# Patient Record
Sex: Male | Born: 1980 | Race: White | Hispanic: No | Marital: Married | Smoking: Current every day smoker
Health system: Southern US, Community
[De-identification: ages and names within clinical notes are randomized; demographics above are authoritative.]

## PROBLEM LIST (undated history)

## (undated) DIAGNOSIS — F1110 Opioid abuse, uncomplicated: Secondary | ICD-10-CM

## (undated) DIAGNOSIS — F1910 Other psychoactive substance abuse, uncomplicated: Secondary | ICD-10-CM

## (undated) DIAGNOSIS — F32A Depression, unspecified: Secondary | ICD-10-CM

## (undated) DIAGNOSIS — I10 Essential (primary) hypertension: Secondary | ICD-10-CM

## (undated) DIAGNOSIS — F419 Anxiety disorder, unspecified: Secondary | ICD-10-CM

## (undated) DIAGNOSIS — F1990 Other psychoactive substance use, unspecified, uncomplicated: Secondary | ICD-10-CM

## (undated) HISTORY — PX: SPINE SURGERY: SHX786

## (undated) HISTORY — DX: Depression, unspecified: F32.A

## (undated) HISTORY — DX: Anxiety disorder, unspecified: F41.9

## (undated) HISTORY — DX: Essential (primary) hypertension: I10

---

## 2021-02-05 ENCOUNTER — Inpatient Hospital Stay (HOSPITAL_COMMUNITY)
Admission: EM | Admit: 2021-02-05 | Discharge: 2021-02-07 | DRG: 552 | Payer: No Typology Code available for payment source | Attending: General Surgery | Admitting: General Surgery

## 2021-02-05 ENCOUNTER — Other Ambulatory Visit: Payer: Self-pay

## 2021-02-05 ENCOUNTER — Emergency Department (HOSPITAL_COMMUNITY): Payer: No Typology Code available for payment source

## 2021-02-05 DIAGNOSIS — Z20822 Contact with and (suspected) exposure to covid-19: Secondary | ICD-10-CM | POA: Diagnosis present

## 2021-02-05 DIAGNOSIS — S12110A Anterior displaced Type II dens fracture, initial encounter for closed fracture: Principal | ICD-10-CM | POA: Diagnosis present

## 2021-02-05 DIAGNOSIS — T1490XA Injury, unspecified, initial encounter: Secondary | ICD-10-CM | POA: Diagnosis present

## 2021-02-05 DIAGNOSIS — E876 Hypokalemia: Secondary | ICD-10-CM | POA: Diagnosis present

## 2021-02-05 DIAGNOSIS — Z23 Encounter for immunization: Secondary | ICD-10-CM

## 2021-02-05 DIAGNOSIS — S12100A Unspecified displaced fracture of second cervical vertebra, initial encounter for closed fracture: Secondary | ICD-10-CM

## 2021-02-05 DIAGNOSIS — S01511A Laceration without foreign body of lip, initial encounter: Secondary | ICD-10-CM | POA: Diagnosis present

## 2021-02-05 DIAGNOSIS — S022XXA Fracture of nasal bones, initial encounter for closed fracture: Secondary | ICD-10-CM | POA: Diagnosis present

## 2021-02-05 DIAGNOSIS — S12090A Other displaced fracture of first cervical vertebra, initial encounter for closed fracture: Secondary | ICD-10-CM | POA: Diagnosis present

## 2021-02-05 DIAGNOSIS — S0181XA Laceration without foreign body of other part of head, initial encounter: Secondary | ICD-10-CM | POA: Diagnosis present

## 2021-02-05 DIAGNOSIS — F17210 Nicotine dependence, cigarettes, uncomplicated: Secondary | ICD-10-CM | POA: Diagnosis present

## 2021-02-05 LAB — CBC
HCT: 44.2 % (ref 39.0–52.0)
Hemoglobin: 15.1 g/dL (ref 13.0–17.0)
MCH: 31.3 pg (ref 26.0–34.0)
MCHC: 34.2 g/dL (ref 30.0–36.0)
MCV: 91.7 fL (ref 80.0–100.0)
Platelets: 257 10*3/uL (ref 150–400)
RBC: 4.82 MIL/uL (ref 4.22–5.81)
RDW: 12.9 % (ref 11.5–15.5)
WBC: 11.3 10*3/uL — ABNORMAL HIGH (ref 4.0–10.5)
nRBC: 0 % (ref 0.0–0.2)

## 2021-02-05 LAB — URINALYSIS, ROUTINE W REFLEX MICROSCOPIC
Bacteria, UA: NONE SEEN
Bilirubin Urine: NEGATIVE
Glucose, UA: NEGATIVE mg/dL
Ketones, ur: NEGATIVE mg/dL
Leukocytes,Ua: NEGATIVE
Nitrite: NEGATIVE
Protein, ur: NEGATIVE mg/dL
Specific Gravity, Urine: 1.016 (ref 1.005–1.030)
pH: 6 (ref 5.0–8.0)

## 2021-02-05 LAB — I-STAT CHEM 8, ED
BUN: 15 mg/dL (ref 6–20)
Calcium, Ion: 1.1 mmol/L — ABNORMAL LOW (ref 1.15–1.40)
Chloride: 106 mmol/L (ref 98–111)
Creatinine, Ser: 1 mg/dL (ref 0.61–1.24)
Glucose, Bld: 138 mg/dL — ABNORMAL HIGH (ref 70–99)
HCT: 46 % (ref 39.0–52.0)
Hemoglobin: 15.6 g/dL (ref 13.0–17.0)
Potassium: 3.1 mmol/L — ABNORMAL LOW (ref 3.5–5.1)
Sodium: 142 mmol/L (ref 135–145)
TCO2: 25 mmol/L (ref 22–32)

## 2021-02-05 LAB — COMPREHENSIVE METABOLIC PANEL
ALT: 16 U/L (ref 0–44)
AST: 38 U/L (ref 15–41)
Albumin: 3.9 g/dL (ref 3.5–5.0)
Alkaline Phosphatase: 85 U/L (ref 38–126)
Anion gap: 11 (ref 5–15)
BUN: 14 mg/dL (ref 6–20)
CO2: 24 mmol/L (ref 22–32)
Calcium: 9.1 mg/dL (ref 8.9–10.3)
Chloride: 105 mmol/L (ref 98–111)
Creatinine, Ser: 1.15 mg/dL (ref 0.61–1.24)
GFR, Estimated: >60 mL/min (ref >60)
Glucose, Bld: 137 mg/dL — ABNORMAL HIGH (ref 70–99)
Potassium: 3.5 mmol/L (ref 3.5–5.1)
Sodium: 140 mmol/L (ref 135–145)
Total Bilirubin: 0.9 mg/dL (ref 0.3–1.2)
Total Protein: 7.1 g/dL (ref 6.5–8.1)

## 2021-02-05 LAB — SAMPLE TO BLOOD BANK

## 2021-02-05 LAB — PROTIME-INR
INR: 1 (ref 0.8–1.2)
Prothrombin Time: 13.1 seconds (ref 11.4–15.2)

## 2021-02-05 LAB — CREATININE, SERUM
Creatinine, Ser: 0.92 mg/dL (ref 0.61–1.24)
GFR, Estimated: >60 mL/min (ref >60)

## 2021-02-05 LAB — RAPID URINE DRUG SCREEN, HOSP PERFORMED
Amphetamines: POSITIVE — AB
Barbiturates: NOT DETECTED
Benzodiazepines: POSITIVE — AB
Cocaine: POSITIVE — AB
Opiates: POSITIVE — AB
Tetrahydrocannabinol: NOT DETECTED

## 2021-02-05 LAB — RESP PANEL BY RT-PCR (FLU A&B, COVID) ARPGX2
Influenza A by PCR: NEGATIVE
Influenza B by PCR: NEGATIVE
SARS Coronavirus 2 by RT PCR: NEGATIVE

## 2021-02-05 LAB — MAGNESIUM: Magnesium: 2 mg/dL (ref 1.7–2.4)

## 2021-02-05 LAB — CK: Total CK: 153 U/L (ref 49–397)

## 2021-02-05 LAB — LACTIC ACID, PLASMA: Lactic Acid, Venous: 2.7 mmol/L (ref 0.5–1.9)

## 2021-02-05 LAB — HIV ANTIBODY (ROUTINE TESTING W REFLEX): HIV Screen 4th Generation wRfx: NONREACTIVE

## 2021-02-05 LAB — ETHANOL: Alcohol, Ethyl (B): <10 mg/dL (ref <10)

## 2021-02-05 MED ORDER — LIDOCAINE-EPINEPHRINE 1 %-1:100000 IJ SOLN
20.0000 mL | Freq: Once | INTRAMUSCULAR | Status: AC
  Administered 2021-02-05: 20 mL
  Filled 2021-02-05: qty 1

## 2021-02-05 MED ORDER — METHOCARBAMOL 1000 MG/10ML IJ SOLN
500.0000 mg | Freq: Four times a day (QID) | INTRAVENOUS | Status: DC | PRN
  Administered 2021-02-05: 500 mg via INTRAVENOUS
  Filled 2021-02-05 (×2): qty 5

## 2021-02-05 MED ORDER — METOPROLOL TARTRATE 5 MG/5ML IV SOLN: 5.0000 mg | Freq: Four times a day (QID) | INTRAVENOUS | Status: DC | PRN

## 2021-02-05 MED ORDER — SODIUM CHLORIDE 0.9 % IV BOLUS
1000.0000 mL | Freq: Once | INTRAVENOUS | Status: AC
  Administered 2021-02-05: 1000 mL via INTRAVENOUS

## 2021-02-05 MED ORDER — PANTOPRAZOLE SODIUM 40 MG PO TBEC
40.0000 mg | DELAYED_RELEASE_TABLET | Freq: Every day | ORAL | Status: DC
  Administered 2021-02-07: 10:00:00 40 mg via ORAL
  Filled 2021-02-05: qty 1

## 2021-02-05 MED ORDER — POTASSIUM CHLORIDE 10 MEQ/100ML IV SOLN
10.0000 meq | INTRAVENOUS | Status: AC
  Administered 2021-02-05: 10 meq via INTRAVENOUS
  Filled 2021-02-05 (×2): qty 100

## 2021-02-05 MED ORDER — ONDANSETRON HCL 4 MG/2ML IJ SOLN: 4.0000 mg | Freq: Four times a day (QID) | INTRAMUSCULAR | Status: DC | PRN

## 2021-02-05 MED ORDER — PANTOPRAZOLE SODIUM 40 MG IV SOLR
40.0000 mg | Freq: Every day | INTRAVENOUS | Status: DC
  Administered 2021-02-05 – 2021-02-06 (×2): 40 mg via INTRAVENOUS
  Filled 2021-02-05 (×2): qty 40

## 2021-02-05 MED ORDER — HYDROMORPHONE HCL 1 MG/ML IJ SOLN
1.0000 mg | Freq: Once | INTRAMUSCULAR | Status: AC
  Administered 2021-02-05: 1 mg via INTRAVENOUS
  Filled 2021-02-05 (×2): qty 1

## 2021-02-05 MED ORDER — POTASSIUM CHLORIDE 10 MEQ/100ML IV SOLN
10.0000 meq | INTRAVENOUS | Status: AC
  Administered 2021-02-05 (×3): 10 meq via INTRAVENOUS
  Filled 2021-02-05 (×2): qty 100

## 2021-02-05 MED ORDER — ACETAMINOPHEN 500 MG PO TABS
1000.0000 mg | ORAL_TABLET | Freq: Three times a day (TID) | ORAL | Status: DC
  Administered 2021-02-05 – 2021-02-07 (×5): 1000 mg via ORAL
  Filled 2021-02-05 (×5): qty 2

## 2021-02-05 MED ORDER — SODIUM CHLORIDE 0.9 % IV SOLN: INTRAVENOUS | Status: DC

## 2021-02-05 MED ORDER — CEFAZOLIN SODIUM-DEXTROSE 2-4 GM/100ML-% IV SOLN
2.0000 g | Freq: Once | INTRAVENOUS | Status: AC
  Administered 2021-02-05: 2 g via INTRAVENOUS
  Filled 2021-02-05: qty 100

## 2021-02-05 MED ORDER — ENOXAPARIN SODIUM 30 MG/0.3ML IJ SOSY
30.0000 mg | PREFILLED_SYRINGE | Freq: Two times a day (BID) | INTRAMUSCULAR | Status: DC
  Administered 2021-02-06 – 2021-02-07 (×3): 30 mg via SUBCUTANEOUS
  Filled 2021-02-05 (×2): qty 0.3

## 2021-02-05 MED ORDER — POLYETHYLENE GLYCOL 3350 17 G PO PACK: 17.0000 g | PACK | Freq: Every day | ORAL | Status: DC | PRN

## 2021-02-05 MED ORDER — ACETAMINOPHEN 325 MG PO TABS: 650.0000 mg | ORAL_TABLET | ORAL | Status: DC | PRN

## 2021-02-05 MED ORDER — METHOCARBAMOL 500 MG PO TABS
500.0000 mg | ORAL_TABLET | Freq: Four times a day (QID) | ORAL | Status: DC | PRN
  Administered 2021-02-06 (×2): 500 mg via ORAL
  Filled 2021-02-05 (×2): qty 1

## 2021-02-05 MED ORDER — OXYCODONE HCL 5 MG PO TABS
5.0000 mg | ORAL_TABLET | ORAL | Status: DC | PRN
  Administered 2021-02-05 – 2021-02-07 (×9): 10 mg via ORAL
  Filled 2021-02-05 (×9): qty 2

## 2021-02-05 MED ORDER — DOCUSATE SODIUM 100 MG PO CAPS
100.0000 mg | ORAL_CAPSULE | Freq: Two times a day (BID) | ORAL | Status: DC
  Administered 2021-02-06: 100 mg via ORAL
  Filled 2021-02-05 (×2): qty 1

## 2021-02-05 MED ORDER — SALINE SPRAY 0.65 % NA SOLN
1.0000 | NASAL | Status: DC | PRN
  Filled 2021-02-05: qty 44

## 2021-02-05 MED ORDER — ONDANSETRON 4 MG PO TBDP: 4.0000 mg | ORAL_TABLET | Freq: Four times a day (QID) | ORAL | Status: DC | PRN

## 2021-02-05 MED ORDER — HYDROMORPHONE HCL 1 MG/ML IJ SOLN
1.0000 mg | INTRAMUSCULAR | Status: DC | PRN
  Administered 2021-02-05 – 2021-02-07 (×7): 1 mg via INTRAVENOUS
  Filled 2021-02-05 (×7): qty 1

## 2021-02-05 MED ORDER — TETANUS-DIPHTH-ACELL PERTUSSIS 5-2.5-18.5 LF-MCG/0.5 IM SUSY
0.5000 mL | PREFILLED_SYRINGE | Freq: Once | INTRAMUSCULAR | Status: AC
  Administered 2021-02-05: 0.5 mL via INTRAMUSCULAR
  Filled 2021-02-05: qty 0.5

## 2021-02-05 MED ORDER — SODIUM CHLORIDE 0.45 % IV SOLN: INTRAVENOUS | Status: DC
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
## 2021-02-05 NOTE — ED Notes (Signed)
EDP at bedside suturing pt's lacerations.
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
## 2021-02-05 NOTE — ED Notes (Signed)
Delay in getting CT due to difficulty getting IV access

## 2021-02-05 NOTE — ED Notes (Signed)
MD at bedside to attempt US IV.  
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
## 2021-02-05 NOTE — ED Notes (Signed)
Pt transported to CT with RN

## 2021-02-05 NOTE — TOC CAGE-AID Note (Signed)
Transition of Care [facility]) - CAGE-AID Screening   Patient Details  Name: Roberto Duran MRN: [id number] Date of Birth: Dec 05, 1980  [facility]) CM/SW Contact:    Erin Sons, LCSW Phone Number: 02/05/2021, 3:49 PM   Clinical Narrative:  CAGE-AID completed; score of 0. Pt was difficult to understand at times due to mumbling and CSW had to request pt repeat himself at times. Pt explains he has been in [place] only a few weeks. CSW is unable to get a full understanding of pt's living situation and where he is from. He does mention he has a wife but states she does not have her phone. Pt states he does not drink alcohol or use any other substances. CSW explained that his UDS was positive for benzos, opiates, cocaine, and amphetamines. Pt did not respond when CSW explained this. CSW explained his purpose was to offer support and that purpose is not to condemn or get pt into trouble. CSW offered to discuss substance use. Pt did not have a response regarding his positive UDS. CSW explained he would leave resource list and phone number bedside. CSW explained he could call or request social worker if he wanted to discuss substance use further.   CAGE-AID Screening:    Have You Ever Felt You Ought to Cut Down on Your Drinking or Drug Use?: No Have People Annoyed You By Critizing Your Drinking Or Drug Use?: No Have You Felt Bad Or Guilty About Your Drinking Or Drug Use?: No Have You Ever Had a Drink or Used Drugs First Thing In The Morning to Steady Your Nerves or to Get Rid of a Hangover?: No CAGE-AID Score: 0  Substance Abuse Education Offered: Yes
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
## 2021-02-05 NOTE — ED Notes (Signed)
Unable to obtain BP at this time. Attempting IV access on both arms.

## 2021-02-05 NOTE — ED Notes (Signed)
Lac to center forehead, L eyebrow, R upper lip.

## 2021-02-05 NOTE — ED Notes (Addendum)
Pt responding when asking orientation questions, but not speaking clear enough to determine orientation.

## 2021-02-05 NOTE — ED Notes (Signed)
Pt transferred to MRI
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
## 2021-02-05 NOTE — ED Notes (Signed)
Unable to get IV access with [place], by multiple RN and EDP. Per EDP, will get IV access later and get non contrast CT scans at this time.

## 2021-02-05 NOTE — ED Notes (Signed)
IV team at bedside 

## 2021-02-05 NOTE — ED Notes (Signed)
Pt did receive one bag of IV K+. Pharmacy was approached by this RN & will retime the remaining bags to infuse upon his arrival.

## 2021-02-05 NOTE — ED Notes (Signed)
Speech at bedside

## 2021-02-05 NOTE — ED Notes (Signed)
[place] collar placed on pt 

## 2021-02-06 ENCOUNTER — Other Ambulatory Visit (HOSPITAL_COMMUNITY): Payer: Self-pay

## 2021-02-06 ENCOUNTER — Other Ambulatory Visit: Payer: Self-pay

## 2021-02-06 DIAGNOSIS — S0181XA Laceration without foreign body of other part of head, initial encounter: Secondary | ICD-10-CM | POA: Diagnosis present

## 2021-02-06 DIAGNOSIS — F17210 Nicotine dependence, cigarettes, uncomplicated: Secondary | ICD-10-CM | POA: Diagnosis present

## 2021-02-06 DIAGNOSIS — Z23 Encounter for immunization: Secondary | ICD-10-CM | POA: Diagnosis not present

## 2021-02-06 DIAGNOSIS — S12110A Anterior displaced Type II dens fracture, initial encounter for closed fracture: Secondary | ICD-10-CM | POA: Diagnosis present

## 2021-02-06 DIAGNOSIS — Z20822 Contact with and (suspected) exposure to covid-19: Secondary | ICD-10-CM | POA: Diagnosis present

## 2021-02-06 DIAGNOSIS — S01511A Laceration without foreign body of lip, initial encounter: Secondary | ICD-10-CM | POA: Diagnosis present

## 2021-02-06 DIAGNOSIS — T1490XA Injury, unspecified, initial encounter: Secondary | ICD-10-CM | POA: Diagnosis present

## 2021-02-06 DIAGNOSIS — E876 Hypokalemia: Secondary | ICD-10-CM | POA: Diagnosis present

## 2021-02-06 DIAGNOSIS — S022XXA Fracture of nasal bones, initial encounter for closed fracture: Secondary | ICD-10-CM | POA: Diagnosis present

## 2021-02-06 DIAGNOSIS — S12090A Other displaced fracture of first cervical vertebra, initial encounter for closed fracture: Secondary | ICD-10-CM | POA: Diagnosis present

## 2021-02-06 LAB — BASIC METABOLIC PANEL
Anion gap: 8 (ref 5–15)
BUN: 6 mg/dL (ref 6–20)
CO2: 22 mmol/L (ref 22–32)
Calcium: 8.9 mg/dL (ref 8.9–10.3)
Chloride: 107 mmol/L (ref 98–111)
Creatinine, Ser: 0.8 mg/dL (ref 0.61–1.24)
GFR, Estimated: >60 mL/min (ref >60)
Glucose, Bld: 103 mg/dL — ABNORMAL HIGH (ref 70–99)
Potassium: 3.3 mmol/L — ABNORMAL LOW (ref 3.5–5.1)
Sodium: 137 mmol/L (ref 135–145)

## 2021-02-06 LAB — CBC
HCT: 42.4 % (ref 39.0–52.0)
Hemoglobin: 13.9 g/dL (ref 13.0–17.0)
MCH: 30.2 pg (ref 26.0–34.0)
MCHC: 32.8 g/dL (ref 30.0–36.0)
MCV: 92.2 fL (ref 80.0–100.0)
Platelets: 227 10*3/uL (ref 150–400)
RBC: 4.6 MIL/uL (ref 4.22–5.81)
RDW: 13.1 % (ref 11.5–15.5)
WBC: 8.7 10*3/uL (ref 4.0–10.5)
nRBC: 0 % (ref 0.0–0.2)

## 2021-02-06 MED ORDER — IBUPROFEN 800 MG PO TABS: 800.0000 mg | ORAL_TABLET | Freq: Three times a day (TID) | ORAL | 0 refills | Status: DC | PRN

## 2021-02-06 MED ORDER — POTASSIUM CHLORIDE 10 MEQ/100ML IV SOLN
10.0000 meq | INTRAVENOUS | Status: AC
  Administered 2021-02-06 (×4): 10 meq via INTRAVENOUS
  Filled 2021-02-06 (×4): qty 100

## 2021-02-06 MED ORDER — ACETAMINOPHEN 500 MG PO TABS
1000.0000 mg | ORAL_TABLET | Freq: Three times a day (TID) | ORAL | 0 refills | Status: AC
  Filled 2021-02-06: qty 42, 7d supply, fill #0

## 2021-02-06 MED ORDER — DOCUSATE SODIUM 100 MG PO CAPS
100.0000 mg | ORAL_CAPSULE | Freq: Every day | ORAL | 0 refills | Status: DC | PRN
Start: 2021-02-06 — End: 2021-05-27
  Filled 2021-02-06: qty 10, 10d supply, fill #0

## 2021-02-06 MED ORDER — METHOCARBAMOL 500 MG PO TABS
500.0000 mg | ORAL_TABLET | Freq: Four times a day (QID) | ORAL | 0 refills | Status: AC | PRN
  Filled 2021-02-06: qty 28, 7d supply, fill #0

## 2021-02-06 MED ORDER — OXYCODONE HCL 5 MG PO TABS
5.0000 mg | ORAL_TABLET | Freq: Four times a day (QID) | ORAL | 0 refills | Status: AC | PRN
  Filled 2021-02-06: qty 15, 4d supply, fill #0

## 2021-02-06 NOTE — Progress Notes (Signed)
Mobility Specialist Progress Note:   02/06/21 1500  Mobility  Activity Sat and stood x 3  Level of Assistance Standby assist, set-up cues, supervision of patient - no hands on  Mobility Response Tolerated poorly  Mobility performed by Mobility specialist  $Mobility charge 1 Mobility   Pt tearful about wife and children this afternoon. Pt stood independently, hoping to ambulate in hallway.  Upon standing, pt felt nauseous and light-headed d/t significant pain. Further mobility deferred, pt back in bed.  Addison Lank Mobility Specialist  Phone [phone]
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
## 2021-02-06 NOTE — Progress Notes (Signed)
Patients wife did not show up for collar teaching. Wife will receive teaching tomorrow morning and then patient will be discharged after.
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
## 2021-02-07 NOTE — Progress Notes (Signed)
Cab transportation was being set up for patient @2 :30p However, [facility] police department came with an arrest warrant for patient . Patient discharged with police with all belongings, discharge paperwork, and discharge medications, and cellphone. IV removed before discharge.

## 2021-02-07 NOTE — Progress Notes (Signed)
Discharge instructions given to pt. Patient verbalized understanding of all teaching.
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
## 2021-02-07 NOTE — TOC Transition Note (Signed)
Transition of Care [facility]) - CM/SW Discharge Note   Patient Details  Name: MERVIN RAMIRES MRN: [id number] Date of Birth: 1981-02-26  [facility]) CM/SW Contact:  Glennon Mac, RN Phone Number: 02/07/2021, 1:41 PM   Clinical Narrative:    Pt medically stable for discharge home today with spouse to assist.  PT/OT recommending OP therapy, and pt agreeable to follow up; referral has been made to [facility] OP Rehab for continued therapies.  Discharge medications have been filled by [facility] pharmacy using MATCH letter, as patient is uninsured.  Originally scheduled transport cancelled, as [employer] officer on unit to arrest patient.  She states he has multiple warrants, and wife has been arrested as well.    Final next level of care: OP Rehab Barriers to Discharge: Barriers Resolved   Patient Goals and CMS Choice Patient states their goals for this hospitalization and ongoing recovery are:: to go home                            Discharge Plan and Services   Discharge Planning Services: CM Consult, Medication Assistance, MATCH Program, [facility], Follow-up appt scheduled            DME Arranged: Walker rolling DME Agency: [employer] Date DME Agency Contacted: 02/06/21 Time DME Agency Contacted: 1300 Representative spoke with at DME Agency: Velna Hatchet                Readmission Risk Interventions No flowsheet data found.  Quintella Baton, RN, BSN  Trauma/Neuro ICU Case Manager [phone]

## 2021-02-22 ENCOUNTER — Ambulatory Visit: Payer: Self-pay | Admitting: *Deleted
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
## 2021-02-22 NOTE — Telephone Encounter (Signed)
Informed of appt availability for 02/23/2021 at [facility] and he could also establish care at that site.   Patient states he his out of town in [place]. He will be back on Monday. Informed that we could reach back out to him with appt available with the [facility] operations.  Patient appreciative and verbalized understanding.

## 2021-02-23 ENCOUNTER — Ambulatory Visit: Payer: Self-pay | Attending: General Surgery

## 2021-02-28 ENCOUNTER — Ambulatory Visit: Payer: Self-pay

## 2021-02-28 ENCOUNTER — Ambulatory Visit: Payer: Self-pay | Admitting: *Deleted
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
## 2021-02-28 NOTE — Telephone Encounter (Signed)
Unable to reach patient after 3 attempts by PEC NT, routing to the provider for resolution per protocol. ° °

## 2021-02-28 NOTE — Telephone Encounter (Signed)
2nd attempt. Pt called, unable to leave VM d/t mailbox full

## 2021-03-20 ENCOUNTER — Other Ambulatory Visit: Payer: Self-pay

## 2021-03-20 ENCOUNTER — Ambulatory Visit: Payer: 59 | Attending: Internal Medicine | Admitting: Internal Medicine

## 2021-03-20 ENCOUNTER — Encounter: Payer: Self-pay | Admitting: Internal Medicine

## 2021-03-20 DIAGNOSIS — F1990 Other psychoactive substance use, unspecified, uncomplicated: Secondary | ICD-10-CM | POA: Diagnosis not present

## 2021-03-20 DIAGNOSIS — S022XXA Fracture of nasal bones, initial encounter for closed fracture: Secondary | ICD-10-CM | POA: Diagnosis not present

## 2021-03-20 DIAGNOSIS — S12100A Unspecified displaced fracture of second cervical vertebra, initial encounter for closed fracture: Secondary | ICD-10-CM

## 2021-03-20 DIAGNOSIS — Z09 Encounter for follow-up examination after completed treatment for conditions other than malignant neoplasm: Secondary | ICD-10-CM | POA: Diagnosis not present
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
## 2021-03-21 ENCOUNTER — Inpatient Hospital Stay: Payer: Self-pay | Admitting: Family Medicine

## 2021-05-08 ENCOUNTER — Other Ambulatory Visit: Payer: Self-pay | Admitting: Neurological Surgery

## 2021-05-10 ENCOUNTER — Inpatient Hospital Stay (HOSPITAL_COMMUNITY)
Admission: RE | Admit: 2021-05-10 | Discharge: 2021-05-10 | Disposition: A | Discharge status: Home / Self Care | Payer: Self-pay | Source: Ambulatory Visit

## 2021-05-10 NOTE — Pre-Procedure Instructions (Signed)
Surgical Instructions ? ? ? Your procedure is scheduled on Thursday, March 23. ? Report to [facility] Main Entrance "A" at 8:00 A.M., then check in with the Admitting office. ? Call this number if you have problems the morning of surgery: ? [phone] ? ? If you have any questions prior to your surgery date call [phone]: Open Monday-Friday 8am-4pm ? ? ? Remember: ? Do not eat after midnight the night before your surgery ? ?You may drink clear liquids until 7:00AM the morning of your surgery.   ?Clear liquids allowed are: Water, Non-Citrus Juices (without pulp), Carbonated Beverages, Clear Tea, Black Coffee ONLY (NO MILK, CREAM OR POWDERED CREAMER of any kind), and Gatorade ?  ? Take these medicines the morning of surgery with A SIP OF WATER: NONE ? ? ?As of today, STOP taking any Aspirin (unless otherwise instructed by your surgeon) Aleve, Naproxen, Ibuprofen, Motrin, Advil, Goody's, BC's, all herbal medications, fish oil, and all vitamins. ? ?         ?Do not wear jewelry or makeup ?Do not wear lotions, powders, perfumes/colognes, or deodorant. ?Do not shave 48 hours prior to surgery.  Men may shave face and neck. ?Do not bring valuables to the hospital. ?Do not wear nail polish, gel polish, artificial nails, or any other type of covering on natural nails (fingers and toes) ?If you have artificial nails or gel coating that need to be removed by a nail salon, please have this removed prior to surgery. Artificial nails or gel coating may interfere with anesthesia's ability to adequately monitor your vital signs. ? ?[facility] is not responsible for any belongings or valuables. .  ? ?Do NOT Smoke (Tobacco/Vaping)  24 hours prior to your procedure ? ?If you use a CPAP at night, you may bring your mask for your overnight stay. ?  ?Contacts, glasses, hearing aids, dentures or partials may not be worn into surgery, please bring cases for these belongings ?  ?For patients admitted to the hospital, discharge time  will be determined by your treatment team. ?  ?Patients discharged the day of surgery will not be allowed to drive home, and someone needs to stay with them for 24 hours. ? ?NO VISITORS WILL BE ALLOWED IN PRE-OP WHERE PATIENTS ARE PREPPED FOR SURGERY.  ONLY 1 SUPPORT PERSON MAY BE PRESENT IN THE WAITING ROOM WHILE YOU ARE IN SURGERY.  IF YOU ARE TO BE ADMITTED, ONCE YOU ARE IN YOUR ROOM YOU WILL BE ALLOWED TWO (2) VISITORS. 1 (ONE) VISITOR MAY STAY OVERNIGHT BUT MUST ARRIVE TO THE ROOM BY 8pm.  Minor children may have two parents present. Special consideration for safety and communication needs will be reviewed on a case by case basis. ? ?Special instructions:   ? ?Oral Hygiene is also important to reduce your risk of infection.  Remember - BRUSH YOUR TEETH THE MORNING OF SURGERY WITH YOUR REGULAR TOOTHPASTE ? ? ?[facility]- Preparing For Surgery ? ?Before surgery, you can play an important role. Because skin is not sterile, your skin needs to be as free of germs as possible. You can reduce the number of germs on your skin by washing with CHG (chlorahexidine gluconate) Soap before surgery.  CHG is an antiseptic cleaner which kills germs and bonds with the skin to continue killing germs even after washing.   ? ? ?Please do not use if you have an allergy to CHG or antibacterial soaps. If your skin becomes reddened/irritated stop using the CHG.  ?Do not  shave (including legs and underarms) for at least 48 hours prior to first CHG shower. It is OK to shave your face. ? ?Please follow these instructions carefully. ?  ? ? Shower the NIGHT BEFORE SURGERY and the MORNING OF SURGERY with CHG Soap.  ? If you chose to wash your hair, wash your hair first as usual with your normal shampoo. After you shampoo, rinse your hair and body thoroughly to remove the shampoo.  Then [employer] and genitals (private parts) with your normal soap and rinse thoroughly to remove soap. ? ?After that Use CHG Soap as you would any other liquid  soap. You can apply CHG directly to the skin and wash gently with a scrungie or a clean washcloth.  ? ?Apply the CHG Soap to your body ONLY FROM THE NECK DOWN.  Do not use on open wounds or open sores. Avoid contact with your eyes, ears, mouth and genitals (private parts). Wash Face and genitals (private parts)  with your normal soap.  ? ?Wash thoroughly, paying special attention to the area where your surgery will be performed. ? ?Thoroughly rinse your body with warm water from the neck down. ? ?DO NOT shower/wash with your normal soap after using and rinsing off the CHG Soap. ? ?Pat yourself dry with a CLEAN TOWEL. ? ?Wear CLEAN PAJAMAS to bed the night before surgery ? ?Place CLEAN SHEETS on your bed the night before your surgery ? ?DO NOT SLEEP WITH PETS. ? ? ?Day of Surgery: ? ?Take a shower with CHG soap. ?Wear Clean/Comfortable clothing the morning of surgery ?Do not apply any deodorants/lotions.   ?Remember to brush your teeth WITH YOUR REGULAR TOOTHPASTE. ? ? ? ?COVID testing ? ?If you are going to stay overnight or be admitted after your procedure/surgery and require a pre-op COVID test, please follow these instructions after your COVID test  ? ?You are not required to quarantine however you are required to wear a well-fitting mask when you are out and around people not in your household.  If your mask becomes wet or soiled, replace with a new one. ? ?Wash your hands often with soap and water for 20 seconds or clean your hands with an alcohol-based hand sanitizer that contains at least 60% alcohol. ? ?Do not share personal items. ? ?Notify your provider: ?if you are in close contact with someone who has COVID  ?or if you develop a fever of 100.4 or greater, sneezing, cough, sore throat, shortness of breath or body aches. ? ?  ?Please read over the following fact sheets that you were given.  ? ?

## 2021-05-12 ENCOUNTER — Emergency Department (HOSPITAL_COMMUNITY): Payer: 59

## 2021-05-12 ENCOUNTER — Inpatient Hospital Stay (HOSPITAL_COMMUNITY)
Admission: EM | Admit: 2021-05-12 | Discharge: 2021-05-27 | DRG: 853 | Disposition: A | Discharge status: Home / Self Care | Payer: 59 | Attending: Family Medicine | Admitting: Family Medicine

## 2021-05-12 DIAGNOSIS — A4101 Sepsis due to Methicillin susceptible Staphylococcus aureus: Secondary | ICD-10-CM | POA: Diagnosis not present

## 2021-05-12 DIAGNOSIS — F17210 Nicotine dependence, cigarettes, uncomplicated: Secondary | ICD-10-CM | POA: Diagnosis present

## 2021-05-12 DIAGNOSIS — J189 Pneumonia, unspecified organism: Secondary | ICD-10-CM | POA: Diagnosis present

## 2021-05-12 DIAGNOSIS — I330 Acute and subacute infective endocarditis: Secondary | ICD-10-CM | POA: Diagnosis present

## 2021-05-12 DIAGNOSIS — F1990 Other psychoactive substance use, unspecified, uncomplicated: Secondary | ICD-10-CM | POA: Diagnosis present

## 2021-05-12 DIAGNOSIS — M542 Cervicalgia: Secondary | ICD-10-CM | POA: Diagnosis not present

## 2021-05-12 DIAGNOSIS — R258 Other abnormal involuntary movements: Secondary | ICD-10-CM | POA: Diagnosis not present

## 2021-05-12 DIAGNOSIS — Z765 Malingerer [conscious simulation]: Secondary | ICD-10-CM

## 2021-05-12 DIAGNOSIS — Z20822 Contact with and (suspected) exposure to covid-19: Secondary | ICD-10-CM | POA: Diagnosis present

## 2021-05-12 DIAGNOSIS — E876 Hypokalemia: Secondary | ICD-10-CM | POA: Diagnosis present

## 2021-05-12 DIAGNOSIS — M532X2 Spinal instabilities, cervical region: Secondary | ICD-10-CM | POA: Diagnosis present

## 2021-05-12 DIAGNOSIS — I10 Essential (primary) hypertension: Secondary | ICD-10-CM | POA: Diagnosis present

## 2021-05-12 DIAGNOSIS — S12110A Anterior displaced Type II dens fracture, initial encounter for closed fracture: Secondary | ICD-10-CM | POA: Diagnosis present

## 2021-05-12 DIAGNOSIS — Z888 Allergy status to other drugs, medicaments and biological substances status: Secondary | ICD-10-CM

## 2021-05-12 DIAGNOSIS — A419 Sepsis, unspecified organism: Principal | ICD-10-CM

## 2021-05-12 DIAGNOSIS — Z880 Allergy status to penicillin: Secondary | ICD-10-CM

## 2021-05-12 DIAGNOSIS — F1920 Other psychoactive substance dependence, uncomplicated: Secondary | ICD-10-CM | POA: Diagnosis present

## 2021-05-12 DIAGNOSIS — F1120 Opioid dependence, uncomplicated: Secondary | ICD-10-CM | POA: Diagnosis present

## 2021-05-12 HISTORY — DX: Other psychoactive substance use, unspecified, uncomplicated: F19.90

## 2021-05-12 HISTORY — DX: Opioid abuse, uncomplicated: F11.10

## 2021-05-12 HISTORY — DX: Other psychoactive substance abuse, uncomplicated: F19.10

## 2021-05-12 LAB — URINALYSIS, ROUTINE W REFLEX MICROSCOPIC
Bacteria, UA: NONE SEEN
Bilirubin Urine: NEGATIVE
Glucose, UA: NEGATIVE mg/dL
Hgb urine dipstick: NEGATIVE
Ketones, ur: 5 mg/dL — AB
Leukocytes,Ua: NEGATIVE
Nitrite: NEGATIVE
Protein, ur: 100 mg/dL — AB
Specific Gravity, Urine: 1.018 (ref 1.005–1.030)
pH: 6 (ref 5.0–8.0)

## 2021-05-12 MED ORDER — SODIUM CHLORIDE 0.9 % IV SOLN
2.0000 g | Freq: Once | INTRAVENOUS | Status: AC
  Administered 2021-05-12: 2 g via INTRAVENOUS
  Filled 2021-05-12: qty 2

## 2021-05-12 MED ORDER — VANCOMYCIN HCL IN DEXTROSE 1-5 GM/200ML-% IV SOLN
1000.0000 mg | Freq: Once | INTRAVENOUS | Status: DC
  Filled 2021-05-12: qty 200

## 2021-05-12 MED ORDER — ACETAMINOPHEN 325 MG PO TABS
650.0000 mg | ORAL_TABLET | Freq: Once | ORAL | Status: AC
  Administered 2021-05-13: 650 mg via ORAL
  Filled 2021-05-12: qty 2

## 2021-05-12 MED ORDER — METRONIDAZOLE 500 MG/100ML IV SOLN
500.0000 mg | Freq: Once | INTRAVENOUS | Status: AC
  Administered 2021-05-13: 500 mg via INTRAVENOUS
  Filled 2021-05-12: qty 100

## 2021-05-12 MED ORDER — LACTATED RINGERS IV SOLN: INTRAVENOUS | Status: AC

## 2021-05-12 MED ORDER — VANCOMYCIN HCL 2000 MG/400ML IV SOLN
2000.0000 mg | Freq: Once | INTRAVENOUS | Status: AC
  Administered 2021-05-13: 2000 mg via INTRAVENOUS
  Filled 2021-05-12: qty 400

## 2021-05-12 MED ORDER — LACTATED RINGERS IV BOLUS (SEPSIS)
1000.0000 mL | Freq: Once | INTRAVENOUS | Status: AC
  Administered 2021-05-12: 1000 mL via INTRAVENOUS
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
## 2021-05-12 NOTE — ED Triage Notes (Addendum)
Patient BIB EMS for neck pain and stiffiness. Patient has cervical fractures and is scheduled for surgery this week. Patient has taken some "opiate " , not prescribed to him for pain . Patient is A/O x3. Airway is clear , respirations are even and non labored. Patient is a IV drug user reports last use yesterday.  ?

## 2021-05-13 ENCOUNTER — Inpatient Hospital Stay (HOSPITAL_COMMUNITY): Payer: 59

## 2021-05-13 ENCOUNTER — Encounter (HOSPITAL_COMMUNITY): Payer: Self-pay | Admitting: Internal Medicine

## 2021-05-13 DIAGNOSIS — I330 Acute and subacute infective endocarditis: Secondary | ICD-10-CM | POA: Diagnosis present

## 2021-05-13 DIAGNOSIS — R7881 Bacteremia: Secondary | ICD-10-CM

## 2021-05-13 DIAGNOSIS — F17210 Nicotine dependence, cigarettes, uncomplicated: Secondary | ICD-10-CM | POA: Diagnosis present

## 2021-05-13 DIAGNOSIS — M532X2 Spinal instabilities, cervical region: Secondary | ICD-10-CM | POA: Diagnosis present

## 2021-05-13 DIAGNOSIS — A419 Sepsis, unspecified organism: Secondary | ICD-10-CM | POA: Diagnosis not present

## 2021-05-13 DIAGNOSIS — J189 Pneumonia, unspecified organism: Secondary | ICD-10-CM | POA: Diagnosis present

## 2021-05-13 DIAGNOSIS — M542 Cervicalgia: Secondary | ICD-10-CM | POA: Diagnosis present

## 2021-05-13 DIAGNOSIS — F1920 Other psychoactive substance dependence, uncomplicated: Secondary | ICD-10-CM | POA: Diagnosis not present

## 2021-05-13 DIAGNOSIS — S12100K Unspecified displaced fracture of second cervical vertebra, subsequent encounter for fracture with nonunion: Secondary | ICD-10-CM

## 2021-05-13 DIAGNOSIS — Z880 Allergy status to penicillin: Secondary | ICD-10-CM | POA: Diagnosis not present

## 2021-05-13 DIAGNOSIS — R258 Other abnormal involuntary movements: Secondary | ICD-10-CM | POA: Diagnosis not present

## 2021-05-13 DIAGNOSIS — S12110A Anterior displaced Type II dens fracture, initial encounter for closed fracture: Secondary | ICD-10-CM | POA: Diagnosis present

## 2021-05-13 DIAGNOSIS — F1990 Other psychoactive substance use, unspecified, uncomplicated: Secondary | ICD-10-CM | POA: Diagnosis not present

## 2021-05-13 DIAGNOSIS — A4101 Sepsis due to Methicillin susceptible Staphylococcus aureus: Secondary | ICD-10-CM | POA: Diagnosis present

## 2021-05-13 DIAGNOSIS — E876 Hypokalemia: Secondary | ICD-10-CM | POA: Diagnosis present

## 2021-05-13 DIAGNOSIS — S1202XA Unstable burst fracture of first cervical vertebra, initial encounter for closed fracture: Secondary | ICD-10-CM | POA: Diagnosis not present

## 2021-05-13 DIAGNOSIS — Z20822 Contact with and (suspected) exposure to covid-19: Secondary | ICD-10-CM | POA: Diagnosis present

## 2021-05-13 DIAGNOSIS — F1120 Opioid dependence, uncomplicated: Secondary | ICD-10-CM | POA: Diagnosis present

## 2021-05-13 DIAGNOSIS — Z765 Malingerer [conscious simulation]: Secondary | ICD-10-CM | POA: Diagnosis not present

## 2021-05-13 DIAGNOSIS — Z888 Allergy status to other drugs, medicaments and biological substances status: Secondary | ICD-10-CM | POA: Diagnosis not present

## 2021-05-13 DIAGNOSIS — I10 Essential (primary) hypertension: Secondary | ICD-10-CM | POA: Diagnosis present

## 2021-05-13 LAB — BLOOD CULTURE ID PANEL (REFLEXED) - BCID2

## 2021-05-13 LAB — COMPREHENSIVE METABOLIC PANEL
ALT: 15 U/L (ref 0–44)
AST: 26 U/L (ref 15–41)
Albumin: 3 g/dL — ABNORMAL LOW (ref 3.5–5.0)
Alkaline Phosphatase: 76 U/L (ref 38–126)
Anion gap: 10 (ref 5–15)
BUN: 7 mg/dL (ref 6–20)
CO2: 23 mmol/L (ref 22–32)
Calcium: 8.8 mg/dL — ABNORMAL LOW (ref 8.9–10.3)
Chloride: 95 mmol/L — ABNORMAL LOW (ref 98–111)
Creatinine, Ser: 0.74 mg/dL (ref 0.61–1.24)
GFR, Estimated: >60 mL/min (ref >60)
Glucose, Bld: 156 mg/dL — ABNORMAL HIGH (ref 70–99)
Potassium: 3.9 mmol/L (ref 3.5–5.1)
Sodium: 128 mmol/L — ABNORMAL LOW (ref 135–145)
Total Bilirubin: 1 mg/dL (ref 0.3–1.2)
Total Protein: 7 g/dL (ref 6.5–8.1)

## 2021-05-13 LAB — CBC WITH DIFFERENTIAL/PLATELET
Abs Immature Granulocytes: 0.12 10*3/uL — ABNORMAL HIGH (ref 0.00–0.07)
Basophils Absolute: 0 10*3/uL (ref 0.0–0.1)
Basophils Relative: 0 %
Eosinophils Absolute: 0 10*3/uL (ref 0.0–0.5)
Eosinophils Relative: 0 %
HCT: 37.8 % — ABNORMAL LOW (ref 39.0–52.0)
Hemoglobin: 12.8 g/dL — ABNORMAL LOW (ref 13.0–17.0)
Immature Granulocytes: 1 %
Lymphocytes Relative: 2 %
Lymphs Abs: 0.4 10*3/uL — ABNORMAL LOW (ref 0.7–4.0)
MCH: 30.1 pg (ref 26.0–34.0)
MCHC: 33.9 g/dL (ref 30.0–36.0)
MCV: 88.9 fL (ref 80.0–100.0)
Monocytes Absolute: 1.1 10*3/uL — ABNORMAL HIGH (ref 0.1–1.0)
Monocytes Relative: 5 %
Neutro Abs: 18.9 10*3/uL — ABNORMAL HIGH (ref 1.7–7.7)
Neutrophils Relative %: 92 %
Platelets: 220 10*3/uL (ref 150–400)
RBC: 4.25 MIL/uL (ref 4.22–5.81)
RDW: 13.5 % (ref 11.5–15.5)
WBC: 20.5 10*3/uL — ABNORMAL HIGH (ref 4.0–10.5)
nRBC: 0 % (ref 0.0–0.2)

## 2021-05-13 LAB — CSF CELL COUNT WITH DIFFERENTIAL
RBC Count, CSF: 11 /mm3 — ABNORMAL HIGH
RBC Count, CSF: 1 /mm3 — ABNORMAL HIGH
Tube #: 1
Tube #: 4
WBC, CSF: 3 /mm3 (ref 0–5)
WBC, CSF: 0 /mm3 (ref 0–5)

## 2021-05-13 LAB — ECHOCARDIOGRAM COMPLETE
AR max vel: 2.31 cm2
AV Area VTI: 2.46 cm2
AV Area mean vel: 2.25 cm2
AV Mean grad: 5 mmHg
AV Peak grad: 10 mmHg
Ao pk vel: 1.58 m/s
Area-P 1/2: 4.41 cm2
Height: 72 in
S' Lateral: 3.7 cm
Weight: 3360 oz

## 2021-05-13 LAB — PROTEIN AND GLUCOSE, CSF
Glucose, CSF: 94 mg/dL — ABNORMAL HIGH (ref 40–70)
Total  Protein, CSF: 26 mg/dL (ref 15–45)

## 2021-05-13 LAB — RESP PANEL BY RT-PCR (FLU A&B, COVID) ARPGX2
Influenza A by PCR: NEGATIVE
Influenza B by PCR: NEGATIVE
SARS Coronavirus 2 by RT PCR: NEGATIVE

## 2021-05-13 LAB — CORTISOL-AM, BLOOD: Cortisol - AM: 18.3 ug/dL (ref 6.7–22.6)

## 2021-05-13 LAB — RAPID URINE DRUG SCREEN, HOSP PERFORMED
Amphetamines: POSITIVE — AB
Barbiturates: NOT DETECTED
Benzodiazepines: NOT DETECTED
Cocaine: NOT DETECTED
Opiates: POSITIVE — AB
Tetrahydrocannabinol: NOT DETECTED

## 2021-05-13 LAB — PROTEIN, CSF: Total  Protein, CSF: 26 mg/dL (ref 15–45)

## 2021-05-13 LAB — LACTIC ACID, PLASMA: Lactic Acid, Venous: 1.2 mmol/L (ref 0.5–1.9)

## 2021-05-13 LAB — TROPONIN I (HIGH SENSITIVITY): Troponin I (High Sensitivity): 11 ng/L (ref <18)

## 2021-05-13 LAB — PROTIME-INR
INR: 1.1 (ref 0.8–1.2)
Prothrombin Time: 14.3 seconds (ref 11.4–15.2)

## 2021-05-13 LAB — PROCALCITONIN: Procalcitonin: 1.12 ng/mL

## 2021-05-13 LAB — APTT: aPTT: 37 seconds — ABNORMAL HIGH (ref 24–36)

## 2021-05-13 MED ORDER — IOHEXOL 350 MG/ML SOLN
100.0000 mL | Freq: Once | INTRAVENOUS | Status: AC | PRN
  Administered 2021-05-13: 100 mL via INTRAVENOUS

## 2021-05-13 MED ORDER — MORPHINE SULFATE (PF) 4 MG/ML IV SOLN
4.0000 mg | Freq: Once | INTRAVENOUS | Status: AC
  Administered 2021-05-13: 4 mg via INTRAVENOUS
  Filled 2021-05-13: qty 1

## 2021-05-13 MED ORDER — METRONIDAZOLE 500 MG/100ML IV SOLN
500.0000 mg | Freq: Two times a day (BID) | INTRAVENOUS | Status: DC
  Administered 2021-05-13: 500 mg via INTRAVENOUS
  Filled 2021-05-13: qty 100

## 2021-05-13 MED ORDER — ACETAMINOPHEN 325 MG PO TABS
650.0000 mg | ORAL_TABLET | Freq: Four times a day (QID) | ORAL | Status: DC | PRN
  Administered 2021-05-13 (×2): 650 mg via ORAL
  Filled 2021-05-13 (×3): qty 2

## 2021-05-13 MED ORDER — ONDANSETRON HCL 4 MG PO TABS: 4.0000 mg | ORAL_TABLET | Freq: Four times a day (QID) | ORAL | Status: DC | PRN

## 2021-05-13 MED ORDER — MORPHINE SULFATE (PF) 2 MG/ML IV SOLN: 2.0000 mg | INTRAVENOUS | Status: DC | PRN

## 2021-05-13 MED ORDER — OXYCODONE HCL 5 MG PO TABS
5.0000 mg | ORAL_TABLET | ORAL | Status: DC | PRN
  Administered 2021-05-13 – 2021-05-14 (×3): 10 mg via ORAL
  Filled 2021-05-13: qty 1
  Filled 2021-05-13 (×3): qty 2

## 2021-05-13 MED ORDER — CEFAZOLIN SODIUM-DEXTROSE 2-4 GM/100ML-% IV SOLN
2.0000 g | Freq: Three times a day (TID) | INTRAVENOUS | Status: DC
  Administered 2021-05-13 – 2021-05-27 (×41): 2 g via INTRAVENOUS
  Filled 2021-05-13 (×42): qty 100

## 2021-05-13 MED ORDER — DEXAMETHASONE SODIUM PHOSPHATE 10 MG/ML IJ SOLN
10.0000 mg | Freq: Once | INTRAMUSCULAR | Status: AC
  Administered 2021-05-13: 10 mg via INTRAVENOUS
  Filled 2021-05-13: qty 1

## 2021-05-13 MED ORDER — CEFEPIME HCL 2 G IJ SOLR
2.0000 g | Freq: Three times a day (TID) | INTRAMUSCULAR | Status: DC
  Administered 2021-05-13 (×2): 2 g via INTRAVENOUS
  Filled 2021-05-13 (×2): qty 2

## 2021-05-13 MED ORDER — ONDANSETRON HCL 4 MG/2ML IJ SOLN: 4.0000 mg | Freq: Four times a day (QID) | INTRAMUSCULAR | Status: DC | PRN

## 2021-05-13 MED ORDER — HYDROMORPHONE HCL 1 MG/ML IJ SOLN
0.5000 mg | INTRAMUSCULAR | Status: DC | PRN
  Administered 2021-05-13 – 2021-05-14 (×11): 1 mg via INTRAVENOUS
  Filled 2021-05-13 (×12): qty 1

## 2021-05-13 MED ORDER — ENOXAPARIN SODIUM 40 MG/0.4ML IJ SOSY
40.0000 mg | PREFILLED_SYRINGE | INTRAMUSCULAR | Status: DC
  Administered 2021-05-13 – 2021-05-19 (×7): 40 mg via SUBCUTANEOUS
  Filled 2021-05-13 (×7): qty 0.4

## 2021-05-13 MED ORDER — ACETAMINOPHEN 650 MG RE SUPP: 650.0000 mg | Freq: Four times a day (QID) | RECTAL | Status: DC | PRN

## 2021-05-13 MED ORDER — HYDRALAZINE HCL 25 MG PO TABS
25.0000 mg | ORAL_TABLET | Freq: Three times a day (TID) | ORAL | Status: DC | PRN
  Filled 2021-05-13: qty 1

## 2021-05-13 MED ORDER — VANCOMYCIN HCL 1250 MG/250ML IV SOLN
1250.0000 mg | Freq: Three times a day (TID) | INTRAVENOUS | Status: DC
  Administered 2021-05-13: 1250 mg via INTRAVENOUS
  Filled 2021-05-13 (×2): qty 250

## 2021-05-13 MED ORDER — ONDANSETRON HCL 4 MG/2ML IJ SOLN
4.0000 mg | Freq: Once | INTRAMUSCULAR | Status: AC
  Administered 2021-05-13: 4 mg via INTRAVENOUS
  Filled 2021-05-13: qty 2

## 2021-05-13 NOTE — ED Notes (Signed)
Back from CT, R arm IV infiltrated, scan done w/o, admitting MD notified. ?
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
## 2021-05-13 NOTE — ED Notes (Signed)
Breakfast order Placed ?

## 2021-05-13 NOTE — ED Notes (Signed)
Pt sleepy, awake, interactive, calm, NAD, rates pain 8/10, asking about inpt room assignment, and pain med. Lying flat, supine, c-collar in place.  ?

## 2021-05-13 NOTE — Progress Notes (Signed)
Id brief note ? ? ?Substance abuse ?Admitted ams/neck pain ?Bcx mssa by bcid ? ? ?On appropriate abx ?LP done, csf 0 wbc, 94 glucose, 26 protein ? ? ?-id will formally see tomorrow ?-continue cefazolin ?-repeat bcx ?-tte ?

## 2021-05-13 NOTE — ED Notes (Signed)
Waiting on inpt room to be cleaned. Pending ready bed. ?

## 2021-05-13 NOTE — ED Notes (Signed)
Pt previously declined CT x2, upon questioning is refusing, he became agreeable. Pending CT prior to transport to floor.  ?

## 2021-05-13 NOTE — Assessment & Plan Note (Addendum)
Admits to ongoing IVDU with heroin. UDS positive for opiates and amphetamines on admission.  Discussed need for complete cessation. ?

## 2021-05-13 NOTE — ED Provider Notes (Signed)
Assumed care at shift change.  See prior notes for full H&P.  Briefly, 41 y.o. M here with neck pain/stiffness for the past 3 days.  Known IVDU (heroin).  Also has known neck fractures, due for surgery next week with Dr. Maurice Small.  Febrile here in the ED to 102.72F.   ? ?Plan:  labs, cultures, lactate all sent.  LP also performed, CSF pending.  Neurosurgery consulted, will need medical admission. ? ?Neurosurgery, Dr. Danielle Dess has evaluated in the ED-- placed consultation note.  They will follow along. ? ?Results for orders placed or performed during the hospital encounter of 05/12/21  ?Resp Panel by RT-PCR (Flu A&B, Covid) Nasopharyngeal Swab  ? Specimen: Nasopharyngeal Swab; Nasopharyngeal(NP) swabs in vial transport medium  ?Result Value Ref Range  ? SARS Coronavirus 2 by RT PCR NEGATIVE NEGATIVE  ? Influenza A by PCR NEGATIVE NEGATIVE  ? Influenza B by PCR NEGATIVE NEGATIVE  ?CSF culture w Gram Stain  ? Specimen: CSF; Cerebrospinal Fluid  ?Result Value Ref Range  ? Specimen Description CSF   ? Special Requests NONE   ? Gram Stain    ?  CYTOSPIN SMEAR ?NO WBC SEEN ?NO ORGANISMS SEEN ?Performed at [facility], [address] [place], [place] [address] ?  ? Culture PENDING   ? Report Status PENDING   ?Lactic acid, plasma  ?Result Value Ref Range  ? Lactic Acid, Venous 1.2 0.5 - 1.9 mmol/L  ?Comprehensive metabolic panel  ?Result Value Ref Range  ? Sodium 128 (L) 135 - 145 mmol/L  ? Potassium 3.9 3.5 - 5.1 mmol/L  ? Chloride 95 (L) 98 - 111 mmol/L  ? CO2 23 22 - 32 mmol/L  ? Glucose, Bld 156 (H) 70 - 99 mg/dL  ? BUN 7 6 - 20 mg/dL  ? Creatinine, Ser 0.74 0.61 - 1.24 mg/dL  ? Calcium 8.8 (L) 8.9 - 10.3 mg/dL  ? Total Protein 7.0 6.5 - 8.1 g/dL  ? Albumin 3.0 (L) 3.5 - 5.0 g/dL  ? AST 26 15 - 41 U/L  ? ALT 15 0 - 44 U/L  ? Alkaline Phosphatase 76 38 - 126 U/L  ? Total Bilirubin 1.0 0.3 - 1.2 mg/dL  ? GFR, Estimated >60 >60 mL/min  ? Anion gap 10 5 - 15  ?CBC WITH DIFFERENTIAL  ?Result Value Ref Range  ? WBC 20.5  (H) 4.0 - 10.5 K/uL  ? RBC 4.25 4.22 - 5.81 MIL/uL  ? Hemoglobin 12.8 (L) 13.0 - 17.0 g/dL  ? HCT 37.8 (L) 39.0 - 52.0 %  ? MCV 88.9 80.0 - 100.0 fL  ? MCH 30.1 26.0 - 34.0 pg  ? MCHC 33.9 30.0 - 36.0 g/dL  ? RDW 13.5 11.5 - 15.5 %  ? Platelets 220 150 - 400 K/uL  ? nRBC 0.0 0.0 - 0.2 %  ? Neutrophils Relative % 92 %  ? Neutro Abs 18.9 (H) 1.7 - 7.7 K/uL  ? Lymphocytes Relative 2 %  ? Lymphs Abs 0.4 (L) 0.7 - 4.0 K/uL  ? Monocytes Relative 5 %  ? Monocytes Absolute 1.1 (H) 0.1 - 1.0 K/uL  ? Eosinophils Relative 0 %  ? Eosinophils Absolute 0.0 0.0 - 0.5 K/uL  ? Basophils Relative 0 %  ? Basophils Absolute 0.0 0.0 - 0.1 K/uL  ? Immature Granulocytes 1 %  ? Abs Immature Granulocytes 0.12 (H) 0.00 - 0.07 K/uL  ?Protime-INR  ?Result Value Ref Range  ? Prothrombin Time 14.3 11.4 - 15.2 seconds  ?  INR 1.1 0.8 - 1.2  ?APTT  ?Result Value Ref Range  ? aPTT 37 (H) 24 - 36 seconds  ?Urinalysis, Routine w reflex microscopic Nasopharyngeal Swab  ?Result Value Ref Range  ? Color, Urine YELLOW YELLOW  ? APPearance CLEAR CLEAR  ? Specific Gravity, Urine 1.018 1.005 - 1.030  ? pH 6.0 5.0 - 8.0  ? Glucose, UA NEGATIVE NEGATIVE mg/dL  ? Hgb urine dipstick NEGATIVE NEGATIVE  ? Bilirubin Urine NEGATIVE NEGATIVE  ? Ketones, ur 5 (A) NEGATIVE mg/dL  ? Protein, ur 100 (A) NEGATIVE mg/dL  ? Nitrite NEGATIVE NEGATIVE  ? Leukocytes,Ua NEGATIVE NEGATIVE  ? RBC / HPF 0-5 0 - 5 RBC/hpf  ? WBC, UA 0-5 0 - 5 WBC/hpf  ? Bacteria, UA NONE SEEN NONE SEEN  ? Mucus PRESENT   ?Rapid urine drug screen (hospital performed)  ?Result Value Ref Range  ? Opiates POSITIVE (A) NONE DETECTED  ? Cocaine NONE DETECTED NONE DETECTED  ? Benzodiazepines NONE DETECTED NONE DETECTED  ? Amphetamines POSITIVE (A) NONE DETECTED  ? Tetrahydrocannabinol NONE DETECTED NONE DETECTED  ? Barbiturates NONE DETECTED NONE DETECTED  ?Protein and glucose, CSF  ?Result Value Ref Range  ? Glucose, CSF 94 (H) 40 - 70 mg/dL  ? Total  Protein, CSF 26 15 - 45 mg/dL  ?CSF cell count with  differential collection tube #: 1  ?Result Value Ref Range  ? Tube # 1   ? Color, CSF COLORLESS COLORLESS  ? Appearance, CSF CLEAR (A) CLEAR  ? Supernatant NOT INDICATED   ? RBC Count, CSF 11 (H) 0 /cu mm  ? WBC, CSF 3 0 - 5 /cu mm  ? Segmented Neutrophils-CSF TOO FEW TO COUNT, SMEAR AVAILABLE FOR REVIEW 0 - 6 %  ? Lymphs, CSF TOO FEW TO COUNT, SMEAR AVAILABLE FOR REVIEW 40 - 80 %  ? Monocyte-Macrophage-Spinal Fluid TOO FEW TO COUNT, SMEAR AVAILABLE FOR REVIEW 15 - 45 %  ? Eosinophils, CSF TOO FEW TO COUNT, SMEAR AVAILABLE FOR REVIEW 0 - 1 %  ?Protein, CSF  ?Result Value Ref Range  ? Total  Protein, CSF 26 15 - 45 mg/dL  ?Troponin I (High Sensitivity)  ?Result Value Ref Range  ? Troponin I (High Sensitivity) 11 <18 ng/L  ? ?DG Chest 1 View ? ?Result Date: 05/12/2021 ?CLINICAL DATA:  Torticollis.  Neck pain and stiffness. EXAM: CHEST  1 VIEW COMPARISON:  Radiograph 02/05/2021 FINDINGS: Very low lung volumes limit assessment. Upper normal heart size is likely accentuated by portable AP technique. Patchy bibasilar opacities with air bronchograms. No significant pleural effusion. No pneumothorax. No pulmonary edema. No acute osseous findings. IMPRESSION: Very low lung volumes limit assessment. Patchy bibasilar opacities with air bronchograms may be atelectasis or pneumonia. Electronically Signed   By: Narda Rutherford M.D.   On: 05/12/2021 23:52  ? ?CT Head Wo Contrast ? ?Result Date: 05/13/2021 ?CLINICAL DATA:  Altered mental status.  Follow-up C-spine fracture. EXAM: CT HEAD WITHOUT CONTRAST CT CERVICAL SPINE WITHOUT CONTRAST TECHNIQUE: Multidetector CT imaging of the head and cervical spine was performed following the standard protocol without intravenous contrast. Multiplanar CT image reconstructions of the cervical spine were also generated. RADIATION DOSE REDUCTION: This exam was performed according to the departmental dose-optimization program which includes automated exposure control, adjustment of the mA and/or  kV according to patient size and/or use of iterative reconstruction technique. COMPARISON:  02/05/2021 FINDINGS: CT HEAD FINDINGS Brain: No evidence of acute infarction, hemorrhage, hydrocephalus, extra-axial collection or mass lesion/mass effect.  Asymmetric enlargement of the right lateral ventricle (relative to the left), unchanged. Vascular: No hyperdense vessel or unexpected calcification. Skull: Normal. Negative for fracture or focal lesion. Old left nasal bone fracture. Sinuses/Orbits: The visualized paranasal sinuses are essentially clear. The mastoid air cells are unopacified. Other: None. CT CERVICAL SPINE FINDINGS Alignment: Straightening of the cervical spine, likely positional. 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 Additional stable fracture fragment inferiorly along the anterior arch of C1 (series 9/image 58). Soft tissues and spinal canal: No prevertebral fluid or swelling. No visible canal hematoma. Disc levels: Vertebral body heights are otherwise maintained. Very mild degenerative changes at C5-6 and C6-7. Spinal canal is patent. Upper chest: Visualized lung apices are clear. Other: Visualized thyroid is unremarkable. IMPRESSION: No evidence of acute intracranial abnormality. 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 Additional stable fracture fragment inferiorly along the anterior arch of C1. Electronically Signed   By: Charline BillsSriyesh  Krishnan M.D.   On: 05/13/2021 00:21  ? ?CT Cervical Spine Wo Contrast ? ?Result Date: 05/13/2021 ?CLINICAL DATA:  Altered mental status.  Follow-up C-spine fracture. EXAM: CT HEAD WITHOUT CONTRAST CT CERVICAL SPINE WITHOUT CONTRAST TECHNIQUE: Multidetector CT imaging of the head and cervical spine was performed following the standard protocol without intravenous contrast. Multiplanar CT image reconstructions of the  cervical spine were also generated. RADIATION DOSE REDUCTION: This exam was performed according to the departmental dose-optimization program which includes automated exposure control, adjustment of the mA and
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
## 2021-05-13 NOTE — ED Notes (Signed)
This RN established second IV access and documented in chart - 1 set of blood cultures sent as well as rainbow of labs (2 gold top, 1 blue top, 1 dark green top, 1 light green top, 1 lavender top, and 1 gray top on ice) sent down to lab at this time  ?

## 2021-05-13 NOTE — Assessment & Plan Note (Addendum)
Ongoing, counseled on need for complete cessation. ?--TOC for substance abuse resources ?

## 2021-05-13 NOTE — Assessment & Plan Note (Addendum)
Patient presenting to ED with neck pain/stiffness.  Was found to be febrile with temperature 102.6 ?F, tachycardic, tachypneic.  Elevated WBC count of 20.5.   Chest x-ray with findings consistent with atelectasis versus pneumonia.Underwent lumbar puncture in the ED with CSF results not indicative of meningitis.Blood cultures showed MSSA.TTE with no vegetations reported. Was not a  candidate for TEE due to cervical spine fracture.Infectious disease following and will let us know if he needs TEE. ?Leukocytosis improved.Repeat blood cultures 3/20: NGTD.  On cefazolin ?

## 2021-05-13 NOTE — Plan of Care (Signed)

## 2021-05-13 NOTE — Progress Notes (Addendum)
Pharmacy Antibiotic Note ? ?Roberto Duran is a 41 y.o. male admitted on 05/12/2021 with  sepsis .  Pharmacy has been consulted for vancomycin and cefepime dosing. ? ?WBC: 20.5 ? ?Plan: ?Vancomycin 2000 mg IV x 1 ?Vancomycin 1250 mg IV Q 8 hrs. Goal AUC 400-550. ?Expected AUC: 475 ?SCr used: 0.8 (actual 0.74) ? ?Cefepime 2 g every 8 hours ? ?Height: 6' (182.9 cm) ?Weight: 95.3 kg (210 lb) ?IBW/kg (Calculated) : 77.6 ? ?Temp (24hrs), Avg:102.6 ?F (39.2 ?C), Min:102.6 ?F (39.2 ?C), Max:102.6 ?F (39.2 ?C) ? ?Recent Labs  ?Lab 05/13/21 ?[id number]  ?WBC 20.5*  ?CREATININE 0.74  ?LATICACIDVEN 1.2  ?  ?Estimated Creatinine Clearance: 145.6 mL/min (by C-G formula based on SCr of 0.74 mg/dL).   ? ?Allergies  ?Allergen Reactions  ? Penicillins Hives  ?  bloating  ? Prochlorperazine Edisylate Other (See Comments)  ?  Coming out of skin ?Compazine ?Hallucination  ? ? ?Antimicrobials this admission: ?Vancomycin 3/19  >>  ?Metronidazole 3/19 >>  ?Cefepime 3/18 >> ? ?Thank you for allowing pharmacy to be a part of this patientRobertos care. ? ?Carma Lair, PharmD Candidate [phone] ?05/13/2021 5:05 AM ? ?

## 2021-05-13 NOTE — Progress Notes (Signed)
Pt arrived to room 5N17 via stretcher from the ED. Received report from [place], Charity fundraiser. See assessment. Will continue to monitor.  ?

## 2021-05-13 NOTE — Progress Notes (Signed)
PHARMACY - PHYSICIAN COMMUNICATION ?CRITICAL VALUE ALERT - BLOOD CULTURE IDENTIFICATION (BCID) ? ?Roberto Duran is an 41 y.o. male who presented to [facility] on 05/12/2021 with a chief complaint of neck pain/stiffness. ? ?Assessment:  Endorses continued IV heroin use. WBC 20, LA 1.2. 2/4 bottles in Bcx growing GPC in clusters- BCID showing staph aureus (no resistance so MSSA). Has PCN allergy with hives/bloating - has tolerated cephalosporins including cefazolin in the past. ? ?Name of physician (or Provider) Contacted: Dr [place] ? ?Current antibiotics: Cefepime, metronidazole, and vancomycin ? ?Changes to prescribed antibiotics recommended:  ?Change antibiotics to cefazolin 2g IV every 8 hours. ID will get auto-consulted.  ? ?Results for orders placed or performed during the hospital encounter of 05/12/21  ?Blood Culture ID Panel (Reflexed) (Collected: 05/12/2021 10:20 PM)  ?Result Value Ref Range  ? Enterococcus faecalis NOT DETECTED NOT DETECTED  ? Enterococcus Faecium NOT DETECTED NOT DETECTED  ? Listeria monocytogenes NOT DETECTED NOT DETECTED  ? Staphylococcus species DETECTED (A) NOT DETECTED  ? Staphylococcus aureus (BCID) DETECTED (A) NOT DETECTED  ? Staphylococcus epidermidis NOT DETECTED NOT DETECTED  ? Staphylococcus lugdunensis NOT DETECTED NOT DETECTED  ? Streptococcus species NOT DETECTED NOT DETECTED  ? Streptococcus agalactiae NOT DETECTED NOT DETECTED  ? Streptococcus pneumoniae NOT DETECTED NOT DETECTED  ? Streptococcus pyogenes NOT DETECTED NOT DETECTED  ? A.calcoaceticus-baumannii NOT DETECTED NOT DETECTED  ? Bacteroides fragilis NOT DETECTED NOT DETECTED  ? Enterobacterales NOT DETECTED NOT DETECTED  ? Enterobacter cloacae complex NOT DETECTED NOT DETECTED  ? Escherichia coli NOT DETECTED NOT DETECTED  ? Klebsiella aerogenes NOT DETECTED NOT DETECTED  ? Klebsiella oxytoca NOT DETECTED NOT DETECTED  ? Klebsiella pneumoniae NOT DETECTED NOT DETECTED  ? Proteus species NOT DETECTED NOT DETECTED   ? Salmonella species NOT DETECTED NOT DETECTED  ? Serratia marcescens NOT DETECTED NOT DETECTED  ? Haemophilus influenzae NOT DETECTED NOT DETECTED  ? Neisseria meningitidis NOT DETECTED NOT DETECTED  ? Pseudomonas aeruginosa NOT DETECTED NOT DETECTED  ? Stenotrophomonas maltophilia NOT DETECTED NOT DETECTED  ? Candida albicans NOT DETECTED NOT DETECTED  ? Candida auris NOT DETECTED NOT DETECTED  ? Candida glabrata NOT DETECTED NOT DETECTED  ? Candida krusei NOT DETECTED NOT DETECTED  ? Candida parapsilosis NOT DETECTED NOT DETECTED  ? Candida tropicalis NOT DETECTED NOT DETECTED  ? Cryptococcus neoformans/gattii NOT DETECTED NOT DETECTED  ? Meth resistant mecA/C and MREJ NOT DETECTED NOT DETECTED  ? ? ?Sherron Monday, PharmD, BCCCP ?Clinical Pharmacist  ?Phone: [phone] ?05/13/2021 4:31 PM ? ?Please check AMION for all [facility] phone numbers ?After 10:00 PM, call Main Pharmacy [phone] ? ? ?

## 2021-05-13 NOTE — ED Notes (Signed)
Pt to CT

## 2021-05-13 NOTE — ED Notes (Signed)
Echo at BS 

## 2021-05-13 NOTE — Sepsis Progress Note (Addendum)
[facility] following for sepsis protocol     Communication via secured chat has been ongoing with bedside ED RN concerning Lactic acid, apparently crossed miscommunications with orders/lab for results to be placed in epic, Blood cultures done with B/P stable, Abx and fluids given  ?

## 2021-05-13 NOTE — Progress Notes (Signed)
?  Echocardiogram ?2D Echocardiogram has been performed. ? ?Roberto Duran ?05/13/2021, 5:01 PM ?

## 2021-05-14 DIAGNOSIS — A4101 Sepsis due to Methicillin susceptible Staphylococcus aureus: Secondary | ICD-10-CM

## 2021-05-14 LAB — CBC
HCT: 41.8 % (ref 39.0–52.0)
Hemoglobin: 14.2 g/dL (ref 13.0–17.0)
MCH: 30 pg (ref 26.0–34.0)
MCHC: 34 g/dL (ref 30.0–36.0)
MCV: 88.4 fL (ref 80.0–100.0)
Platelets: 288 10*3/uL (ref 150–400)
RBC: 4.73 MIL/uL (ref 4.22–5.81)
RDW: 13.7 % (ref 11.5–15.5)
WBC: 23.8 10*3/uL — ABNORMAL HIGH (ref 4.0–10.5)
nRBC: 0 % (ref 0.0–0.2)

## 2021-05-14 LAB — SURGICAL PCR SCREEN
MRSA, PCR: NEGATIVE
Staphylococcus aureus: POSITIVE — AB

## 2021-05-14 LAB — BASIC METABOLIC PANEL
Anion gap: 9 (ref 5–15)
BUN: 12 mg/dL (ref 6–20)
CO2: 27 mmol/L (ref 22–32)
Calcium: 9.4 mg/dL (ref 8.9–10.3)
Chloride: 100 mmol/L (ref 98–111)
Creatinine, Ser: 0.7 mg/dL (ref 0.61–1.24)
GFR, Estimated: >60 mL/min (ref >60)
Glucose, Bld: 118 mg/dL — ABNORMAL HIGH (ref 70–99)
Potassium: 3.5 mmol/L (ref 3.5–5.1)
Sodium: 136 mmol/L (ref 135–145)

## 2021-05-14 LAB — PROCALCITONIN: Procalcitonin: 0.86 ng/mL

## 2021-05-14 LAB — URINE CULTURE: Culture: NO GROWTH

## 2021-05-14 MED ORDER — CHLORHEXIDINE GLUCONATE CLOTH 2 % EX PADS
6.0000 | MEDICATED_PAD | Freq: Every day | CUTANEOUS | Status: AC
  Administered 2021-05-14 – 2021-05-18 (×5): 6 via TOPICAL

## 2021-05-14 MED ORDER — MUPIROCIN 2 % EX OINT
1.0000 "application " | TOPICAL_OINTMENT | Freq: Two times a day (BID) | CUTANEOUS | Status: AC
  Administered 2021-05-14 – 2021-05-18 (×10): 1 via NASAL
  Filled 2021-05-14 (×3): qty 22

## 2021-05-14 MED ORDER — OXYCODONE HCL 5 MG PO TABS
10.0000 mg | ORAL_TABLET | ORAL | Status: DC | PRN
  Administered 2021-05-14 – 2021-05-15 (×4): 15 mg via ORAL
  Filled 2021-05-14 (×4): qty 3

## 2021-05-14 MED ORDER — HYDROMORPHONE HCL 1 MG/ML IJ SOLN
1.0000 mg | INTRAMUSCULAR | Status: DC | PRN
  Administered 2021-05-14 – 2021-05-21 (×57): 1 mg via INTRAVENOUS
  Filled 2021-05-14 (×58): qty 1

## 2021-05-14 NOTE — Consult Note (Signed)
[facility]                                                                                        Patient Identification: Patient Name: Roberto Duran MRN: [id number] Admit Date: 05/12/2021 10:01 PM Today's Date: 05/14/2021 Reason for consult: staph bacteremia  Requesting provider: Eric [place]  Principal Problem:   Sepsis [facility]) Active Problems:   Dens fracture (HCC)   IVDU (intravenous drug user)   Polysubstance (including opioids) dependence with physiological dependence (HCC)   Antibiotics:  Vancomycin 3/18-3/19 Cefepime 3/18-3/19 Metronidazole 3/18-3/19  Lines/Hardware:  Assessment MSSA bacteremia TTE negative for vegetations and endocarditis.   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 Please call with questions or concerns.  Thank you for the consult  Odette Fraction, MD Infectious Disease Physician [facility] [address] [place], [place] [address] Phone: [phone]  Fax: [phone]  __________________________________________________________________________________________________________ HPI and Hospital Course: 41 year old male with PMH of of IVDU , type II odontoid fracture several months ago which was managed conservatively presented to the ED on 3/18 with altered mental status, neck pain and stiffness for 3 days.  Last use of heroin was day prior to admit.  Also noted to have  slurring speech at ED. patient had a recent visit with neurosurgery where he was advised for need of surgical stabilization of C-spine, scheduled on 3/23.  Patient seen at bedside with his wife. Severe neck pain starting last Thursday associated with headache. Denies any weakness, numbness, tingling or urinary and bowel incontinence.  Also had chills and nausea that started same day. Denies fevers, vomiting. Denies chest pain, SOB and cough. Had difficulty with urination per wife. Denies any peripheral joint pain/swelling and tenderness.   At ED, febrile with Tmax 102.6 WBC 20.5 3/181/2 sets MSSA, bcid as MSSA CSF 3/19 WBC 0, G 94, Protein 26, gram stain negative, cx pending   TTE 3/19 with no vegetations   ROS: all systems reviewed and negative except as stated above  Past Medical History:  Diagnosis Date   Heroin abuse (HCC)    IVDU (intravenous drug user)    Polysubstance abuse (HCC)    History reviewed. No pertinent surgical history.   Scheduled Meds:  Chlorhexidine Gluconate Cloth  6 each Topical Q0600   enoxaparin (LOVENOX) injection  40 mg Subcutaneous Q24H   mupirocin ointment  1 application. Nasal BID   Continuous Infusions:   ceFAZolin (ANCEF) IV 2 g (05/14/21 0249)   PRN Meds:.acetaminophen **OR** acetaminophen, hydrALAZINE, HYDROmorphone (DILAUDID) injection, ondansetron **OR** ondansetron (ZOFRAN) IV, oxyCODONE  Allergies  Allergen Reactions   Penicillins Hives    bloating  Prochlorperazine Edisylate Other (See Comments)    Coming out of skin Compazine Hallucination   Social History   Socioeconomic History   Marital status: Married    Spouse name: Not on file   Number of children: 2   Years of education: Not on file   Highest education level: Some college, no degree  Occupational History   Not on file  Tobacco Use   Smoking status: Every Day    Types: Cigarettes   Smokeless tobacco: Not on file  Substance and Sexual Activity   Alcohol use: Yes     Comment: occasionally   Drug use: Yes    Types: Amphetamines, Heroin   Sexual activity: Not on file  Other Topics Concern   Not on file  Social History Narrative   Not on file   Social Determinants of Health   Financial Resource Strain: Not on file  Food Insecurity: Not on file  Transportation Needs: Not on file  Physical Activity: Not on file  Stress: Not on file  Social Connections: Not on file  Intimate Partner Violence: Not on file   Breast Cancer-relatedfamily history is not on file.   Vitals  BP 131/75 (BP Location: Right Arm)   Pulse 70   Temp 97.9 F (36.6 C) (Oral)   Resp 15   Ht 6' (1.829 m)   Wt 98.3 kg   SpO2 98%   BMI 29.39 kg/m   Physical Exam Constitutional:  lying in the bed and in moderate distress    Comments: has a c collar  Cardiovascular:     Rate and Rhythm: Normal rate and regular rhythm.     Heart sounds:  Pulmonary:     Effort: Pulmonary effort is normal.     Comments: clear breath sounds   Abdominal:     Palpations: Abdomen is soft.     Tenderness: non tender and non distended   Musculoskeletal:        General: No swelling or tenderness.   Skin:    Comments:   Neurological:     General: Grossly non focal, back not examined due to + of c collar. Awake, alert and oriented. Power 5/5 in all extremities. PERRLA  Psychiatric:        Mood and Affect: Mood normal.    Pertinent Microbiology Results for orders placed or performed during the hospital encounter of 05/12/21  Blood Culture (routine x 2)     Status: None (Preliminary result)   Collection Time: 05/12/21  2:57 AM   Specimen: BLOOD  Result Value Ref Range Status   Specimen Description BLOOD SITE NOT SPECIFIED  Final   Special Requests   Final    BOTTLES DRAWN AEROBIC AND ANAEROBIC Blood Culture adequate volume   Culture   Final    NO GROWTH 1 DAY Performed at [facility], [address] [place], [place] [address]    Report Status PENDING  Incomplete  Blood  Culture (routine x 2)     Status: Abnormal (Preliminary result)   Collection Time: 05/12/21 10:20 PM   Specimen: BLOOD  Result Value Ref Range Status   Specimen Description BLOOD LEFT UPPER ARM  Final   Special Requests   Final    BOTTLES DRAWN AEROBIC AND ANAEROBIC Blood Culture adequate volume   Culture  Setup Time   Final    GRAM POSITIVE COCCI IN CLUSTERS IN BOTH AEROBIC AND ANAEROBIC BOTTLES Organism ID to follow CRITICAL RESULT CALLED TO, READ BACK BY AND  VERIFIED WITH: Jone Baseman PHARMD, AT 1604 05/13/21 BY D. VANHOOK    Culture (A)  Final    STAPHYLOCOCCUS AUREUS SUSCEPTIBILITIES TO FOLLOW Performed at [facility], [address], [place], [place] [address]    Report Status PENDING  Incomplete  Blood Culture ID Panel (Reflexed)     Status: Abnormal   Collection Time: 05/12/21 10:20 PM  Result Value Ref Range Status   Enterococcus faecalis NOT DETECTED NOT DETECTED Final   Enterococcus Faecium NOT DETECTED NOT DETECTED Final   Listeria monocytogenes NOT DETECTED NOT DETECTED Final   Staphylococcus species DETECTED (A) NOT DETECTED Final    Comment: CRITICAL RESULT CALLED TO, READ BACK BY AND VERIFIED WITH: Jone Baseman PHARMD, AT 1620 05/13/21 D.VANHOOK    Staphylococcus aureus (BCID) DETECTED (A) NOT DETECTED Final    Comment: CRITICAL RESULT CALLED TO, READ BACK BY AND VERIFIED WITH: Jone Baseman PHARMD, AT 1620 05/13/21 D.VANHOOK    Staphylococcus epidermidis NOT DETECTED NOT DETECTED Final   Staphylococcus lugdunensis NOT DETECTED NOT DETECTED Final   Streptococcus species NOT DETECTED NOT DETECTED Final   Streptococcus agalactiae NOT DETECTED NOT DETECTED Final   Streptococcus pneumoniae NOT DETECTED NOT DETECTED Final   Streptococcus pyogenes NOT DETECTED NOT DETECTED Final   A.calcoaceticus-baumannii NOT DETECTED NOT DETECTED Final   Bacteroides fragilis NOT DETECTED NOT DETECTED Final   Enterobacterales NOT DETECTED NOT DETECTED Final   Enterobacter cloacae complex  NOT DETECTED NOT DETECTED Final   Escherichia coli NOT DETECTED NOT DETECTED Final   Klebsiella aerogenes NOT DETECTED NOT DETECTED Final   Klebsiella oxytoca NOT DETECTED NOT DETECTED Final   Klebsiella pneumoniae NOT DETECTED NOT DETECTED Final   Proteus species NOT DETECTED NOT DETECTED Final   Salmonella species NOT DETECTED NOT DETECTED Final   Serratia marcescens NOT DETECTED NOT DETECTED Final   Haemophilus influenzae NOT DETECTED NOT DETECTED Final   Neisseria meningitidis NOT DETECTED NOT DETECTED Final   Pseudomonas aeruginosa NOT DETECTED NOT DETECTED Final   Stenotrophomonas maltophilia NOT DETECTED NOT DETECTED Final   Candida albicans NOT DETECTED NOT DETECTED Final   Candida auris NOT DETECTED NOT DETECTED Final   Candida glabrata NOT DETECTED NOT DETECTED Final   Candida krusei NOT DETECTED NOT DETECTED Final   Candida parapsilosis NOT DETECTED NOT DETECTED Final   Candida tropicalis NOT DETECTED NOT DETECTED Final   Cryptococcus neoformans/gattii NOT DETECTED NOT DETECTED Final   Meth resistant mecA/C and MREJ NOT DETECTED NOT DETECTED Final    Comment: Performed at [facility], [address], [place], [place] [address]  Resp Panel by RT-PCR (Flu A&B, Covid) Nasopharyngeal Swab     Status: None   Collection Time: 05/12/21 10:56 PM   Specimen: Nasopharyngeal Swab; Nasopharyngeal(NP) swabs in vial transport medium  Result Value Ref Range Status   SARS Coronavirus 2 by RT PCR NEGATIVE NEGATIVE Final    Comment: (NOTE) SARS-CoV-2 target nucleic acids are NOT DETECTED.  The SARS-CoV-2 RNA is generally detectable in upper respiratory specimens during the acute phase of infection. The lowest concentration of SARS-CoV-2 viral copies this assay can detect is 138 copies/mL. A negative result does not preclude SARS-Cov-2 infection and should not be used as the sole basis for treatment or other patient management decisions. A negative result may occur with  improper  specimen collection/handling, submission of specimen other than nasopharyngeal swab, presence of viral mutation(s) within the areas targeted by this assay, and inadequate number of viral copies(<138 copies/mL). A negative result  must be combined with clinical observations, patient history, and epidemiological information. The expected result is Negative.  Fact Sheet for Patients:  BloggerCourse.com  Fact Sheet for Healthcare Providers:  SeriousBroker.it  This test is no t yet approved or cleared by the [place] FDA and  has been authorized for detection and/or diagnosis of SARS-CoV-2 by FDA under an Emergency Use Authorization (EUA). This EUA will remain  in effect (meaning this test can be used) for the duration of the COVID-19 declaration under Section 564(b)(1) of the Act, 21 U.S.C.section 360bbb-3(b)(1), unless the authorization is terminated  or revoked sooner.       Influenza A by PCR NEGATIVE NEGATIVE Final   Influenza B by PCR NEGATIVE NEGATIVE Final    Comment: (NOTE) The Xpert Xpress SARS-CoV-2/FLU/RSV plus assay is intended as an aid in the diagnosis of influenza from Nasopharyngeal swab specimens and should not be used as a sole basis for treatment. Nasal washings and aspirates are unacceptable for Xpert Xpress SARS-CoV-2/FLU/RSV testing.  Fact Sheet for Patients: BloggerCourse.com  Fact Sheet for Healthcare Providers: SeriousBroker.it  This test is not yet approved or cleared by the [place] FDA and has been authorized for detection and/or diagnosis of SARS-CoV-2 by FDA under an Emergency Use Authorization (EUA). This EUA will remain in effect (meaning this test can be used) for the duration of the COVID-19 declaration under Section 564(b)(1) of the Act, 21 U.S.C. section 360bbb-3(b)(1), unless the authorization is terminated or revoked.  Performed at  [facility], [address], [place], [place] [address]   CSF culture w Gram Stain     Status: None (Preliminary result)   Collection Time: 05/13/21  1:40 AM   Specimen: CSF; Cerebrospinal Fluid  Result Value Ref Range Status   Specimen Description CSF  Final   Special Requests NONE  Final   Gram Stain   Final    CYTOSPIN SMEAR NO WBC SEEN NO ORGANISMS SEEN Performed at [facility], [address], [place], [place] [address]    Culture PENDING  Incomplete   Report Status PENDING  Incomplete  Surgical pcr screen     Status: Abnormal   Collection Time: 05/14/21  2:45 AM   Specimen: Nasal Mucosa; Nasal Swab  Result Value Ref Range Status   MRSA, PCR NEGATIVE NEGATIVE Final   Staphylococcus aureus POSITIVE (A) NEGATIVE Final    Comment: (NOTE) The Xpert SA Assay (FDA approved for NASAL specimens in patients 86 years of age and older), is one component of a comprehensive surveillance program. It is not intended to diagnose infection nor to guide or monitor treatment. Performed at [facility], [address], [place], [place] [address]     Pertinent Lab seen by me: CBC Latest Ref Rng & Units 05/14/2021 05/13/2021 02/06/2021  WBC 4.0 - 10.5 K/uL 23.8(H) 20.5(H) 8.7  Hemoglobin 13.0 - 17.0 g/dL 69.6 12.8(L) 13.9  Hematocrit 39.0 - 52.0 % 41.8 37.8(L) 42.4  Platelets 150 - 400 K/uL 288 220 227   CMP Latest Ref Rng & Units 05/14/2021 05/13/2021 02/06/2021  Glucose 70 - 99 mg/dL 295(M) 841(L) 244(W)  BUN 6 - 20 mg/dL 12 7 6   Creatinine 0.61 - 1.24 mg/dL 1.02 7.25 3.66  Sodium 135 - 145 mmol/L 136 128(L) 137  Potassium 3.5 - 5.1 mmol/L 3.5 3.9 3.3(L)  Chloride 98 - 111 mmol/L 100 95(L) 107  CO2 22 - 32 mmol/L 27 23 22   Calcium 8.9 - 10.3 mg/dL 9.4 4.4(I)  8.9  Total Protein 6.5 - 8.1 g/dL - 7.0 -  Total Bilirubin 0.3 - 1.2 mg/dL - 1.0 -  Alkaline Phos 38 - 126 U/L - 76 -  AST 15 - 41 U/L - 26 -  ALT 0 - 44 U/L - 15 -     Pertinent Imagings/Other  Imagings Plain films and CT images have been personally visualized and interpreted; radiology reports have been reviewed. Decision making incorporated into the Impression / Recommendations. DG Chest 1 View  Result Date: 05/12/2021 CLINICAL DATA:  Torticollis.  Neck pain and stiffness. EXAM: CHEST  1 VIEW COMPARISON:  Radiograph 02/05/2021 FINDINGS: Very low lung volumes limit assessment. Upper normal heart size is likely accentuated by portable AP technique. Patchy bibasilar opacities with air bronchograms. No significant pleural effusion. No pneumothorax. No pulmonary edema. No acute osseous findings. IMPRESSION: Very low lung volumes limit assessment. Patchy bibasilar opacities with air bronchograms may be atelectasis or pneumonia. Electronically Signed   By: Narda Rutherford M.D.   On: 05/12/2021 23:52   CT Head Wo Contrast  Result Date: 05/13/2021 CLINICAL DATA:  Altered mental status.  Follow-up C-spine fracture. EXAM: CT HEAD WITHOUT CONTRAST CT CERVICAL SPINE WITHOUT CONTRAST TECHNIQUE: Multidetector CT imaging of the head and cervical spine was performed following the standard protocol without intravenous contrast. Multiplanar CT image reconstructions of the cervical spine were also generated. RADIATION DOSE REDUCTION: This exam was performed according to the departmental dose-optimization program which includes automated exposure control, adjustment of the mA and/or kV according to patient size and/or use of iterative reconstruction technique. COMPARISON:  02/05/2021 FINDINGS: CT HEAD FINDINGS Brain: No evidence of acute infarction, hemorrhage, hydrocephalus, extra-axial collection or mass lesion/mass effect. Asymmetric enlargement of the right lateral ventricle (relative to the left), unchanged. Vascular: No hyperdense vessel or unexpected calcification. Skull: Normal. Negative for fracture or focal lesion. Old left nasal bone fracture. Sinuses/Orbits: The visualized paranasal sinuses are  essentially clear. The mastoid air cells are unopacified. Other: None. CT CERVICAL SPINE FINDINGS Alignment: Straightening of the cervical spine, likely positional. 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 Additional stable fracture fragment inferiorly along the anterior arch of C1 (series 9/image 58). Soft tissues and spinal canal: No prevertebral fluid or swelling. No visible canal hematoma. Disc levels: Vertebral body heights are otherwise maintained. Very mild degenerative changes at C5-6 and C6-7. Spinal canal is patent. Upper chest: Visualized lung apices are clear. Other: Visualized thyroid is unremarkable. IMPRESSION: No evidence of acute intracranial abnormality. 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 Additional stable fracture fragment inferiorly along the anterior arch of C1. Electronically Signed   By: Charline Bills M.D.   On: 05/13/2021 00:21   CT Cervical Spine Wo Contrast  Result Date: 05/13/2021 CLINICAL DATA:  Altered mental status.  Follow-up C-spine fracture. EXAM: CT HEAD WITHOUT CONTRAST CT CERVICAL SPINE WITHOUT CONTRAST TECHNIQUE: Multidetector CT imaging of the head and cervical spine was performed following the standard protocol without intravenous contrast. Multiplanar CT image reconstructions of the cervical spine were also generated. RADIATION DOSE REDUCTION: This exam was performed according to the departmental dose-optimization program which includes automated exposure control, adjustment of the mA and/or kV according to patient size and/or use of iterative reconstruction technique. COMPARISON:  02/05/2021 FINDINGS: CT HEAD FINDINGS Brain: No evidence of acute infarction, hemorrhage, hydrocephalus, extra-axial collection or mass lesion/mass effect. Asymmetric enlargement of the  right lateral ventricle (relative to the left),  unchanged. Vascular: No hyperdense vessel or unexpected calcification. Skull: Normal. Negative for fracture or focal lesion. Old left nasal bone fracture. Sinuses/Orbits: The visualized paranasal sinuses are essentially clear. The mastoid air cells are unopacified. Other: None. CT CERVICAL SPINE FINDINGS Alignment: Straightening of the cervical spine, likely positional. 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 Additional stable fracture fragment inferiorly along the anterior arch of C1 (series 9/image 58). Soft tissues and spinal canal: No prevertebral fluid or swelling. No visible canal hematoma. Disc levels: Vertebral body heights are otherwise maintained. Very mild degenerative changes at C5-6 and C6-7. Spinal canal is patent. Upper chest: Visualized lung apices are clear. Other: Visualized thyroid is unremarkable. IMPRESSION: No evidence of acute intracranial abnormality. 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 Additional stable fracture fragment inferiorly along the anterior arch of C1. Electronically Signed   By: Charline Bills M.D.   On: 05/13/2021 00:21   CT FOREARM LEFT W CONTRAST  Result Date: 05/13/2021 CLINICAL DATA:  Soft tissue infection suspected of the forearm. EXAM: CT OF THE UPPER LEFT EXTREMITY WITH CONTRAST TECHNIQUE: Multidetector CT imaging of the upper left extremity was performed according to the standard protocol following intravenous contrast administration. **Patient's IV infiltrated. No other IV access was available to repeat this study. RADIATION DOSE REDUCTION: This exam was performed according to the departmental dose-optimization program which includes automated exposure control, adjustment of the mA and/or kV according to patient size and/or use of iterative reconstruction technique. CONTRAST:  OMNIPAQUE IOHEXOL 350  MG/ML SOLN COMPARISON:  None. FINDINGS: Bones/Joint/Cartilage No acute fracture is identified.  No cortical erosion. Ligaments Suboptimally assessed by CT. Muscles and Tendons Normal signal and size. Soft tissues No significant intravascular contrast is detected. There is increased density and soft tissue stranding within the subcutaneous fat of the antecubital elbow (axial series 4 images 19 through 21). There is also increased density and swelling within the subcutaneous fat of the proximal posteromedial forearm (axial series 4, image 21). There is moderate soft tissue swelling and subcutaneous fat stranding about the distal forearm and wrist. IMPRESSION:: IMPRESSION: 1. The patient's IV infiltrated, and no definite IV contrast is visualized on this examination. 2. No acute fracture or cortical erosion. 3. Soft tissue swelling and edema throughout the left wrist and within the volar elbow. Electronically Signed   By: Neita Garnet M.D.   On: 05/13/2021 15:50   ECHOCARDIOGRAM COMPLETE  Result Date: 05/13/2021    ECHOCARDIOGRAM REPORT   Patient Name:   JARVIN CARDONA Date of Exam: 05/13/2021 Medical Rec #:  [id number]       Height:       72.0 in Accession #:    [id number]      Weight:       210.0 lb Date of Birth:  October 03, 1980      BSA:          2.175 m Patient Age:    40 years        BP:           134/104 mmHg Patient Gender: M               HR:           72 bpm. Exam Location:  Inpatient Procedure: 2D Echo, Cardiac Doppler and Color Doppler Indications:    Bacteremia  History:        Patient has no prior history of Echocardiogram examinations.  Sonographer:    Roosvelt Maser RDCS Referring Phys: [id number] ERIC J [place] IMPRESSIONS  1. Left ventricular ejection fraction, by estimation, is 50 to 55%. The left ventricle has low normal function. The left ventricle has no regional wall motion abnormalities. Left ventricular diastolic parameters were normal.  2. Right ventricular systolic function is normal. The right  ventricular size is normal. Tricuspid regurgitation signal is inadequate for assessing PA pressure.  3. The mitral valve is normal in structure. Trivial mitral valve regurgitation. No evidence of mitral stenosis.  4. The aortic valve is tricuspid. Aortic valve regurgitation is not visualized. No aortic stenosis is present.  5. The inferior vena cava is normal in size with greater than 50% respiratory variability, suggesting right atrial pressure of 3 mmHg. Comparison(s): No prior Echocardiogram. FINDINGS  Left Ventricle: Left ventricular ejection fraction, by estimation, is 50 to 55%. The left ventricle has low normal function. The left ventricle has no regional wall motion abnormalities. The left ventricular internal cavity size was normal in size. There is no left ventricular hypertrophy. Left ventricular diastolic parameters were normal. Right Ventricle: The right ventricular size is normal. Right ventricular systolic function is normal. Tricuspid regurgitation signal is inadequate for assessing PA pressure. The tricuspid regurgitant velocity is 2.44 m/s, and with an assumed right atrial  pressure of 3 mmHg, the estimated right ventricular systolic pressure is 26.8 mmHg. Left Atrium: Left atrial size was normal in size. Right Atrium: Right atrial size was normal in size. Pericardium: There is no evidence of pericardial effusion. Mitral Valve: The mitral valve is normal in structure. Trivial mitral valve regurgitation. No evidence of mitral valve stenosis. Tricuspid Valve: The tricuspid valve is normal in structure. Tricuspid valve regurgitation is trivial. No evidence of tricuspid stenosis. Aortic Valve: The aortic valve is tricuspid. Aortic valve regurgitation is not visualized. No aortic stenosis is present. Aortic valve mean gradient measures 5.0 mmHg. Aortic valve peak gradient measures 10.0 mmHg. Aortic valve area, by VTI measures 2.46  cm. Pulmonic Valve: The pulmonic valve was normal in structure.  Pulmonic valve regurgitation is trivial. No evidence of pulmonic stenosis. Aorta: The aortic root is normal in size and structure. Venous: The inferior vena cava is normal in size with greater than 50% respiratory variability, suggesting right atrial pressure of 3 mmHg. IAS/Shunts: No atrial level shunt detected by color flow Doppler.  LEFT VENTRICLE PLAX 2D LVIDd:         5.00 cm   Diastology LVIDs:         3.70 cm   LV e' medial:    9.70 cm/s LV PW:         0.90 cm   LV E/e' medial:  9.3 LV IVS:        1.00 cm   LV e' lateral:   11.90 cm/s LVOT diam:     2.20 cm   LV E/e' lateral: 7.6 LV SV:         77 LV SV Index:   35 LVOT Area:     3.80 cm  RIGHT VENTRICLE          IVC RV Basal diam:  3.40 cm  IVC diam: 2.50 cm TAPSE (M-mode): 3.2 cm LEFT ATRIUM             Index        RIGHT ATRIUM  Index LA diam:        3.20 cm 1.47 cm/m   RA Area:     12.50 cm LA Vol (A2C):   54.6 ml 25.10 ml/m  RA Volume:   28.60 ml  13.15 ml/m LA Vol (A4C):   49.3 ml 22.66 ml/m LA Biplane Vol: 54.1 ml 24.87 ml/m  AORTIC VALVE AV Area (Vmax):    2.31 cm AV Area (Vmean):   2.25 cm AV Area (VTI):     2.46 cm AV Vmax:           158.00 cm/s AV Vmean:          107.000 cm/s AV VTI:            0.312 m AV Peak Grad:      10.0 mmHg AV Mean Grad:      5.0 mmHg LVOT Vmax:         95.90 cm/s LVOT Vmean:        63.400 cm/s LVOT VTI:          0.202 m LVOT/AV VTI ratio: 0.65  AORTA Ao Root diam: 3.40 cm MITRAL VALVE               TRICUSPID VALVE MV Area (PHT): 4.41 cm    TR Peak grad:   23.8 mmHg MV Decel Time: 172 msec    TR Vmax:        244.00 cm/s MV E velocity: 90.40 cm/s MV A velocity: 64.30 cm/s  SHUNTS MV E/A ratio:  1.41        Systemic VTI:  0.20 m                            Systemic Diam: 2.20 cm Olga Millers MD Electronically signed by Olga Millers MD Signature Date/Time: 05/13/2021/6:06:17 PM    Final      I spent more than 80 minutes for this patient encounter including review of prior medical records/discussing  diagnostics and treatment plan with the patient/family/coordinate care with primary/other specialits with greater than 50% of time in face to face encounter.   Electronically signed by:   Odette Fraction, MD Infectious Disease Physician [facility] for Infectious Disease Pager: [phone]
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
## 2021-05-15 DIAGNOSIS — E876 Hypokalemia: Secondary | ICD-10-CM | POA: Diagnosis not present

## 2021-05-15 DIAGNOSIS — A4101 Sepsis due to Methicillin susceptible Staphylococcus aureus: Secondary | ICD-10-CM | POA: Diagnosis not present

## 2021-05-15 LAB — CULTURE, BLOOD (ROUTINE X 2): Special Requests: ADEQUATE

## 2021-05-15 LAB — HSV 1/2 PCR, CSF
HSV-1 DNA: NEGATIVE
HSV-2 DNA: NEGATIVE

## 2021-05-15 LAB — BASIC METABOLIC PANEL
Anion gap: 12 (ref 5–15)
BUN: 12 mg/dL (ref 6–20)
CO2: 26 mmol/L (ref 22–32)
Calcium: 8.8 mg/dL — ABNORMAL LOW (ref 8.9–10.3)
Chloride: 96 mmol/L — ABNORMAL LOW (ref 98–111)
Creatinine, Ser: 0.72 mg/dL (ref 0.61–1.24)
GFR, Estimated: >60 mL/min (ref >60)
Glucose, Bld: 111 mg/dL — ABNORMAL HIGH (ref 70–99)
Potassium: 3.2 mmol/L — ABNORMAL LOW (ref 3.5–5.1)
Sodium: 134 mmol/L — ABNORMAL LOW (ref 135–145)

## 2021-05-15 LAB — CBC
HCT: 30.2 % — ABNORMAL LOW (ref 39.0–52.0)
Hemoglobin: 10.4 g/dL — ABNORMAL LOW (ref 13.0–17.0)
MCH: 30.8 pg (ref 26.0–34.0)
MCHC: 34.4 g/dL (ref 30.0–36.0)
MCV: 89.3 fL (ref 80.0–100.0)
Platelets: 315 10*3/uL (ref 150–400)
RBC: 3.38 MIL/uL — ABNORMAL LOW (ref 4.22–5.81)
RDW: 13.8 % (ref 11.5–15.5)
WBC: 15.3 10*3/uL — ABNORMAL HIGH (ref 4.0–10.5)
nRBC: 0 % (ref 0.0–0.2)

## 2021-05-15 LAB — HEPATITIS B SURFACE ANTIGEN: Hepatitis B Surface Ag: NONREACTIVE

## 2021-05-15 LAB — HEPATITIS B CORE ANTIBODY, TOTAL: Hep B Core Total Ab: NONREACTIVE

## 2021-05-15 LAB — HIV ANTIBODY (ROUTINE TESTING W REFLEX): HIV Screen 4th Generation wRfx: NONREACTIVE

## 2021-05-15 LAB — PROCALCITONIN: Procalcitonin: 0.65 ng/mL

## 2021-05-15 LAB — MAGNESIUM: Magnesium: 1.9 mg/dL (ref 1.7–2.4)

## 2021-05-15 MED ORDER — OXYCODONE HCL ER 15 MG PO T12A
15.0000 mg | EXTENDED_RELEASE_TABLET | Freq: Two times a day (BID) | ORAL | Status: DC
  Administered 2021-05-15 – 2021-05-21 (×13): 15 mg via ORAL
  Filled 2021-05-15 (×13): qty 1

## 2021-05-15 MED ORDER — POTASSIUM CHLORIDE CRYS ER 20 MEQ PO TBCR
40.0000 meq | EXTENDED_RELEASE_TABLET | ORAL | Status: AC
  Administered 2021-05-15 (×2): 40 meq via ORAL
  Filled 2021-05-15 (×2): qty 2

## 2021-05-15 MED ORDER — FENTANYL 50 MCG/HR TD PT72: 1.0000 | MEDICATED_PATCH | TRANSDERMAL | Status: DC

## 2021-05-15 MED ORDER — OXYCODONE HCL 5 MG PO TABS
10.0000 mg | ORAL_TABLET | ORAL | Status: DC | PRN
  Administered 2021-05-15 – 2021-05-23 (×13): 10 mg via ORAL
  Filled 2021-05-15 (×17): qty 2

## 2021-05-15 NOTE — Assessment & Plan Note (Addendum)
Being monitored and supplemented as needed ?

## 2021-05-15 NOTE — Plan of Care (Signed)
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
## 2021-05-15 NOTE — Progress Notes (Signed)
Pt. Stated he was so much pain he was seeing red and blue. Doc. Consulted about situation asked for a nero work up ?

## 2021-05-15 NOTE — Progress Notes (Signed)
Pt. Refused nero work up stated " I wasn't really seeing colors" Doc notifed ?

## 2021-05-15 NOTE — Progress Notes (Signed)
? ?[facility] Infectious Diseases Follow Up Note ? ?Patient Identification: ?Patient Name: Roberto Duran MRN: [id number] Admit Date: 05/12/2021 10:01 PM ?Age: 41 y.o.Today's Date: 05/15/2021 ? ? ?Reason for Visit: ? ?Principal Problem: ?  MSSA (methicillin susceptible Staphylococcus aureus) septicemia (HCC), Sepsis ?Active Problems: ?  Dens fracture (HCC) ?  IVDU (intravenous drug user) ?  Polysubstance (including opioids) dependence with physiological dependence (HCC) ? ?Antibiotics:  ?Vancomycin 3/18-3/19 ?Cefepime 3/18-3/19 ?Metronidazole 3/18-3/19 ?Cefazolin 3/19-c ? ?Lines/Hardware: PIVs  ? ?Interval Events: continues to be afebrile, WBC down from 23 to 15  ? 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 ?Recommendations ?Continue cefazolin as is  ?Fu blood cultures ?Discussed with Neurosurgery Dr Maurice Small yesterday regarding timing of cervical fixation which would require hardware placement. He is planning for a week of appropriate abtx and negative follow up blood cultures before C spine surgery which I think is reasonable if OK to wait until then from NeuroSx standpoint.  Ideally would have recommended full treatment of bacteremia before any surgery requiring osteofixation. However, this may not be feasible in his case given the high c spine location with some displacement and potential for cord compression. Patient and wife are both aware about the situation.  ?Anticipate prolonged abtx given cannot r/o endocarditis as well as plan for c spine osteofixation  ?Monitor CBC and BMP ?Fu HIV and hepatitis serology  ? ?Rest of the management as per the primary team. ?Thank you for the consult. Please page with pertinent questions or concerns. ? ?______________________________________________________________________ ?Subjective ?patient seen and examined at  the bedside.  ?He is sleeping  ?Has a rigid c collar  ? ?Vitals ?BP (!) 148/92 (BP Location: Right Arm)   Pulse 66   Temp 98.9 ?F (37.2 ?C) (Oral)   Resp 15   Ht 6' (1.829 m)   Wt 98.3 kg   SpO2 100%   BMI 29.39 kg/m? ' ?  ?Physical Exam ?Constitutional:  sleeping  ?   Comments: C collar  ? ?Cardiovascular:  ?   Rate and Rhythm: Normal rate and regular rhythm.  ?   Heart sounds:  ? ?Pulmonary:  ?   Effort: Pulmonary effort is normal.  ?   Comments:  ? ?Abdominal:  ?   Palpations: Abdomen is soft.  ?   Tenderness:  ? ?Musculoskeletal:     ?   General: No swelling or tenderness.  ? ?Skin: ?   Comments:  ? ?Neurological:  ?   General: grossly non focal  ? ?Pertinent Microbiology ?Results for orders placed or performed during the hospital encounter of 05/12/21  ?Blood Culture (routine x 2)     Status: None (Preliminary result)  ? Collection Time: 05/12/21  2:57 AM  ? Specimen: BLOOD  ?Result Value Ref Range Status  ? Specimen Description BLOOD SITE NOT SPECIFIED  Final  ? Special Requests   Final  ?  BOTTLES DRAWN AEROBIC AND ANAEROBIC Blood Culture adequate volume  ? Culture   Final  ?  NO GROWTH 2 DAYS ?Performed at [facility], [address] [place], [place] [address] ?  ? Report Status PENDING  Incomplete  ?Blood Culture (routine x 2)     Status: Abnormal  ? Collection Time: 05/12/21 10:20 PM  ? Specimen: BLOOD  ?Result Value Ref Range Status  ? Specimen Description BLOOD LEFT UPPER ARM  Final  ? Special Requests  Final  ?  BOTTLES DRAWN AEROBIC AND ANAEROBIC Blood Culture adequate volume  ? Culture  Setup Time   Final  ?  GRAM POSITIVE COCCI IN CLUSTERS ?IN BOTH AEROBIC AND ANAEROBIC BOTTLES ?Organism ID to follow ?CRITICAL RESULT CALLED TO, READ BACK BY AND VERIFIED WITH: Jone BasemanK. HURTH PHARMD, AT 1604 05/13/21 BY D. VANHOOK ?Performed at [facility], [address] [place], [place] [address] ?  ? Culture STAPHYLOCOCCUS AUREUS (A)  Final  ? Report Status 05/15/2021 FINAL  Final  ? Organism ID,  Bacteria STAPHYLOCOCCUS AUREUS  Final  ?    Susceptibility  ? Staphylococcus aureus - MIC*  ?  CIPROFLOXACIN <=0.5 SENSITIVE Sensitive   ?  ERYTHROMYCIN <=0.25 SENSITIVE Sensitive   ?  GENTAMICIN <=0.5 SENSITIVE Sensitive   ?  OXACILLIN 0.5 SENSITIVE Sensitive   ?  TETRACYCLINE >=16 RESISTANT Resistant   ?  VANCOMYCIN <=0.5 SENSITIVE Sensitive   ?  TRIMETH/SULFA <=10 SENSITIVE Sensitive   ?  CLINDAMYCIN <=0.25 SENSITIVE Sensitive   ?  RIFAMPIN <=0.5 SENSITIVE Sensitive   ?  Inducible Clindamycin NEGATIVE Sensitive   ?  * STAPHYLOCOCCUS AUREUS  ?Blood Culture ID Panel (Reflexed)     Status: Abnormal  ? Collection Time: 05/12/21 10:20 PM  ?Result Value Ref Range Status  ? Enterococcus faecalis NOT DETECTED NOT DETECTED Final  ? Enterococcus Faecium NOT DETECTED NOT DETECTED Final  ? Listeria monocytogenes NOT DETECTED NOT DETECTED Final  ? Staphylococcus species DETECTED (A) NOT DETECTED Final  ?  Comment: CRITICAL RESULT CALLED TO, READ BACK BY AND VERIFIED WITH: ?Jone BasemanK. HURTH PHARMD, AT 1620 05/13/21 D.VANHOOK ?  ? Staphylococcus aureus (BCID) DETECTED (A) NOT DETECTED Final  ?  Comment: CRITICAL RESULT CALLED TO, READ BACK BY AND VERIFIED WITH: ?Jone BasemanK. HURTH PHARMD, AT 1620 05/13/21 D.VANHOOK ?  ? Staphylococcus epidermidis NOT DETECTED NOT DETECTED Final  ? Staphylococcus lugdunensis NOT DETECTED NOT DETECTED Final  ? Streptococcus species NOT DETECTED NOT DETECTED Final  ? Streptococcus agalactiae NOT DETECTED NOT DETECTED Final  ? Streptococcus pneumoniae NOT DETECTED NOT DETECTED Final  ? Streptococcus pyogenes NOT DETECTED NOT DETECTED Final  ? A.calcoaceticus-baumannii NOT DETECTED NOT DETECTED Final  ? Bacteroides fragilis NOT DETECTED NOT DETECTED Final  ? Enterobacterales NOT DETECTED NOT DETECTED Final  ? Enterobacter cloacae complex NOT DETECTED NOT DETECTED Final  ? Escherichia coli NOT DETECTED NOT DETECTED Final  ? Klebsiella aerogenes NOT DETECTED NOT DETECTED Final  ? Klebsiella oxytoca NOT DETECTED NOT  DETECTED Final  ? Klebsiella pneumoniae NOT DETECTED NOT DETECTED Final  ? Proteus species NOT DETECTED NOT DETECTED Final  ? Salmonella species NOT DETECTED NOT DETECTED Final  ? Serratia marcescens NOT DETECTED NOT DETECTED Final  ? Haemophilus influenzae NOT DETECTED NOT DETECTED Final  ? Neisseria meningitidis NOT DETECTED NOT DETECTED Final  ? Pseudomonas aeruginosa NOT DETECTED NOT DETECTED Final  ? Stenotrophomonas maltophilia NOT DETECTED NOT DETECTED Final  ? Candida albicans NOT DETECTED NOT DETECTED Final  ? Candida auris NOT DETECTED NOT DETECTED Final  ? Candida glabrata NOT DETECTED NOT DETECTED Final  ? Candida krusei NOT DETECTED NOT DETECTED Final  ? Candida parapsilosis NOT DETECTED NOT DETECTED Final  ? Candida tropicalis NOT DETECTED NOT DETECTED Final  ? Cryptococcus neoformans/gattii NOT DETECTED NOT DETECTED Final  ? Meth resistant mecA/C and MREJ NOT DETECTED NOT DETECTED Final  ?  Comment: Performed at [facility], [address] [place], [place] [address]  ?Resp Panel by RT-PCR (Flu A&B,  Covid) Nasopharyngeal Swab     Status: None  ? Collection Time: 05/12/21 10:56 PM  ? Specimen: Nasopharyngeal Swab; Nasopharyngeal(NP) swabs in vial transport medium  ?Result Value Ref Range Status  ? SARS Coronavirus 2 by RT PCR NEGATIVE NEGATIVE Final  ?  Comment: (NOTE) ?SARS-CoV-2 target nucleic acids are NOT DETECTED. ? ?The SARS-CoV-2 RNA is generally detectable in upper respiratory ?specimens during the acute phase of infection. The lowest ?concentration of SARS-CoV-2 viral copies this assay can detect is ?138 copies/mL. A negative result does not preclude SARS-Cov-2 ?infection and should not be used as the sole basis for treatment or ?other patient management decisions. A negative result may occur with  ?improper specimen collection/handling, submission of specimen other ?than nasopharyngeal swab, presence of viral mutation(s) within the ?areas targeted by this assay, and inadequate number  of viral ?copies(<138 copies/mL). A negative result must be combined with ?clinical observations, patient history, and epidemiological ?information. The expected result is Negative. ? ?Fact Sheet for Patients:  ?https

## 2021-05-15 NOTE — Progress Notes (Signed)
Pt.tired hiding pain pills in hand to keep, when asked about it he act like taking pilI and dropped in bed I couldn't find them. ?

## 2021-05-15 NOTE — Progress Notes (Signed)
Dr. Olena Heckle notified of hgb 10.4 this morning (14.2 yesterday). No signs of bleeding noted with patient, however, pt has been c/o pain that is not well controlled with current regimen.  ?

## 2021-05-16 DIAGNOSIS — A4101 Sepsis due to Methicillin susceptible Staphylococcus aureus: Secondary | ICD-10-CM | POA: Diagnosis not present

## 2021-05-16 DIAGNOSIS — I10 Essential (primary) hypertension: Secondary | ICD-10-CM

## 2021-05-16 LAB — CBC
HCT: 42.8 % (ref 39.0–52.0)
Hemoglobin: 14.4 g/dL (ref 13.0–17.0)
MCH: 29.4 pg (ref 26.0–34.0)
MCHC: 33.6 g/dL (ref 30.0–36.0)
MCV: 87.5 fL (ref 80.0–100.0)
Platelets: 281 10*3/uL (ref 150–400)
RBC: 4.89 MIL/uL (ref 4.22–5.81)
RDW: 13.6 % (ref 11.5–15.5)
WBC: 11.9 10*3/uL — ABNORMAL HIGH (ref 4.0–10.5)
nRBC: 0 % (ref 0.0–0.2)

## 2021-05-16 LAB — BASIC METABOLIC PANEL
Anion gap: 11 (ref 5–15)
BUN: 5 mg/dL — ABNORMAL LOW (ref 6–20)
CO2: 26 mmol/L (ref 22–32)
Calcium: 9 mg/dL (ref 8.9–10.3)
Chloride: 96 mmol/L — ABNORMAL LOW (ref 98–111)
Creatinine, Ser: 0.64 mg/dL (ref 0.61–1.24)
GFR, Estimated: >60 mL/min (ref >60)
Glucose, Bld: 123 mg/dL — ABNORMAL HIGH (ref 70–99)
Potassium: 3.4 mmol/L — ABNORMAL LOW (ref 3.5–5.1)
Sodium: 133 mmol/L — ABNORMAL LOW (ref 135–145)

## 2021-05-16 LAB — MAGNESIUM: Magnesium: 2.1 mg/dL (ref 1.7–2.4)

## 2021-05-16 LAB — CSF CULTURE W GRAM STAIN: Culture: NO GROWTH

## 2021-05-16 LAB — HEPATITIS B SURFACE ANTIBODY, QUANTITATIVE: Hep B S AB Quant (Post): 4.1 m[IU]/mL — ABNORMAL LOW (ref >9.9)

## 2021-05-16 LAB — HCV AB W REFLEX TO QUANT PCR: HCV Ab: NONREACTIVE

## 2021-05-16 LAB — HCV INTERPRETATION

## 2021-05-16 MED ORDER — AMLODIPINE BESYLATE 10 MG PO TABS
10.0000 mg | ORAL_TABLET | Freq: Every day | ORAL | Status: DC
  Administered 2021-05-16 – 2021-05-27 (×12): 10 mg via ORAL
  Filled 2021-05-16 (×12): qty 1

## 2021-05-16 MED ORDER — LABETALOL HCL 5 MG/ML IV SOLN: 10.0000 mg | INTRAVENOUS | Status: DC | PRN

## 2021-05-16 MED ORDER — POTASSIUM CHLORIDE CRYS ER 20 MEQ PO TBCR
40.0000 meq | EXTENDED_RELEASE_TABLET | Freq: Once | ORAL | Status: AC
Start: 2021-05-16 — End: 2021-05-16
  Administered 2021-05-16: 40 meq via ORAL
  Filled 2021-05-16: qty 2
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
## 2021-05-16 NOTE — Assessment & Plan Note (Signed)
Started on amlodipine 10 mg daily.  Use as needed medications for severe hypertension.  Does not take any medication at home ?

## 2021-05-16 NOTE — Progress Notes (Signed)
? ?[facility] Infectious Diseases Follow Up Note ? ?Patient Identification: ?Patient Name: Roberto Duran MRN: [id number] [place] Date: 05/12/2021 10:01 PM ?Age: 41 y.o.Today's Date: 05/16/2021 ? ? ?Reason for Visit: follow up on bacteremia  ? ?Principal Problem: ?  MSSA (methicillin susceptible Staphylococcus aureus) septicemia ([place]), Sepsis ?Active Problems: ?  Dens fracture ([place]) ?  IVDU (intravenous drug user) ?  Polysubstance (including opioids) dependence with physiological dependence ([place]) ?  Hypokalemia ? ?Antibiotics:  ?Vancomycin 3/18-3/19 ?Cefepime 3/18-3/19 ?Metronidazole 3/18-3/19 ?Cefazolin 3/19-c ? ?Lines/Hardware: PIVs  ? ?Interval Events: continues to be afebrile, WBC down to 11 ? 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 He is planned for OR for c spine osteofixation on 3/27 ?# Leukocytosis - downtrending  ? ?Recommendations ?Continue cefazolin as is  ?Fu blood cultures ?Planned for OR on 3/27 per Neurosurgery to given sometime on abtx as well as blood cultures to clear before OR. However, defer to neurosurgery for need of immediate need for cervical decompression/cord compromise ?Monitor CBC and BMP ?Following peripherally this week. Please call with questions ? ?Rest of the management as per the primary team. ?Thank you for the consult. Please page with pertinent questions or concerns. ? ?______________________________________________________________________ ?Subjective ?patient seen and examined at the bedside.  ?He is lying in the bed ?Neck pain is 8/10 ? ?Vitals ?BP (!) 183/86 (BP Location: Right Arm)   Pulse 69   Temp 99.4 ?F (37.4 ?C) (Oral)   Resp 16   Ht 6' (1.829 m)   Wt 98.3 kg    SpO2 97%   BMI 29.39 kg/m? ' ?  ?Physical Exam ?Constitutional:  sleeping  ?   Comments: C collar  ? ?Cardiovascular:  ?   Rate and Rhythm: Normal rate and regular rhythm.  ?   Heart sounds:  ? ?Pulmonary:  ?   Effort: Pulmonary effort is normal.  ?   Comments:  ? ?Abdominal:  ?   Palpations: Abdomen is soft.  ?   Tenderness:  ? ?Musculoskeletal:     ?   General: No swelling or tenderness.  ? ?Skin: ?   Comments:  ? ?Neurological:  ?   General: grossly non focal. Power is 5/5 in all 4 extremities  ? ?Pertinent Microbiology ?Results for orders placed or performed during the hospital encounter of 05/12/21  ?Blood Culture (routine x 2)     Status: None (Preliminary result)  ? Collection Time: 05/12/21  2:57 AM  ? Specimen: BLOOD  ?Result Value Ref Range Status  ? Specimen Description BLOOD SITE NOT SPECIFIED  Final  ? Special Requests   Final  ?  BOTTLES DRAWN AEROBIC AND ANAEROBIC Blood Culture adequate volume  ? Culture   Final  ?  NO GROWTH 3 DAYS ?Performed at [facility], [place] [address], [place], [place] [address] ?  ? Report Status PENDING  Incomplete  ?Blood Culture (routine x 2)     Status: Abnormal  ? Collection Time: 05/12/21 10:20 PM  ? Specimen: BLOOD  ?Result Value Ref Range Status  ? Specimen Description BLOOD LEFT UPPER ARM  Final  ? Special Requests   Final  ?  BOTTLES DRAWN AEROBIC AND ANAEROBIC Blood Culture adequate volume  ?  Culture  Setup Time   Final  ?  GRAM POSITIVE COCCI IN CLUSTERS ?IN BOTH AEROBIC AND ANAEROBIC BOTTLES ?Organism ID to follow ?CRITICAL RESULT CALLED TO, READ BACK BY AND VERIFIED WITH: Lanae Boast PHARMD, AT 1604 05/13/21 BY D. VANHOOK ?Performed at [facility], [place] [address], [place], [place] [address] ?  ? Culture STAPHYLOCOCCUS AUREUS (A)  Final  ? Report Status 05/15/2021 FINAL  Final  ? Organism ID, Bacteria STAPHYLOCOCCUS AUREUS  Final  ?    Susceptibility  ? Staphylococcus aureus - MIC*  ?  CIPROFLOXACIN <=0.5 SENSITIVE Sensitive   ?  ERYTHROMYCIN <=0.25  SENSITIVE Sensitive   ?  GENTAMICIN <=0.5 SENSITIVE Sensitive   ?  OXACILLIN 0.5 SENSITIVE Sensitive   ?  TETRACYCLINE >=16 RESISTANT Resistant   ?  VANCOMYCIN <=0.5 SENSITIVE Sensitive   ?  TRIMETH/SULFA <=10 SENSITIVE Sensitive   ?  CLINDAMYCIN <=0.25 SENSITIVE Sensitive   ?  RIFAMPIN <=0.5 SENSITIVE Sensitive   ?  Inducible Clindamycin NEGATIVE Sensitive   ?  * STAPHYLOCOCCUS AUREUS  ?Blood Culture ID Panel (Reflexed)     Status: Abnormal  ? Collection Time: 05/12/21 10:20 PM  ?Result Value Ref Range Status  ? Enterococcus faecalis NOT DETECTED NOT DETECTED Final  ? Enterococcus Faecium NOT DETECTED NOT DETECTED Final  ? Listeria monocytogenes NOT DETECTED NOT DETECTED Final  ? Staphylococcus species DETECTED (A) NOT DETECTED Final  ?  Comment: CRITICAL RESULT CALLED TO, READ BACK BY AND VERIFIED WITH: ?Lanae Boast PHARMD, AT 1620 05/13/21 D.VANHOOK ?  ? Staphylococcus aureus (BCID) DETECTED (A) NOT DETECTED Final  ?  Comment: CRITICAL RESULT CALLED TO, READ BACK BY AND VERIFIED WITH: ?K. [place], AT 1620 05/13/21 D.VANHOOK ?  ? Staphylococcus epidermidis NOT DETECTED NOT DETECTED Final  ? Staphylococcus lugdunensis NOT DETECTED NOT DETECTED Final  ? Streptococcus species NOT DETECTED NOT DETECTED Final  ? Streptococcus agalactiae NOT DETECTED NOT DETECTED Final  ? Streptococcus pneumoniae NOT DETECTED NOT DETECTED Final  ? Streptococcus pyogenes NOT DETECTED NOT DETECTED Final  ? A.calcoaceticus-baumannii NOT DETECTED NOT DETECTED Final  ? Bacteroides fragilis NOT DETECTED NOT DETECTED Final  ? Enterobacterales NOT DETECTED NOT DETECTED Final  ? Enterobacter cloacae complex NOT DETECTED NOT DETECTED Final  ? Escherichia coli NOT DETECTED NOT DETECTED Final  ? Klebsiella aerogenes NOT DETECTED NOT DETECTED Final  ? Klebsiella oxytoca NOT DETECTED NOT DETECTED Final  ? Klebsiella pneumoniae NOT DETECTED NOT DETECTED Final  ? Proteus species NOT DETECTED NOT DETECTED Final  ? Salmonella species NOT DETECTED NOT  DETECTED Final  ? Serratia marcescens NOT DETECTED NOT DETECTED Final  ? Haemophilus influenzae NOT DETECTED NOT DETECTED Final  ? Neisseria meningitidis NOT DETECTED NOT DETECTED Final  ? Pseudomonas aeruginosa NOT DETECTED NOT DETECTED Final  ? Stenotrophomonas maltophilia NOT DETECTED NOT DETECTED Final  ? Candida albicans NOT DETECTED NOT DETECTED Final  ? Candida auris NOT DETECTED NOT DETECTED Final  ? Candida glabrata NOT DETECTED NOT DETECTED Final  ? Candida krusei NOT DETECTED NOT DETECTED Final  ? Candida parapsilosis NOT DETECTED NOT DETECTED Final  ? Candida tropicalis NOT DETECTED NOT DETECTED Final  ? Cryptococcus neoformans/gattii NOT DETECTED NOT DETECTED Final  ? Meth resistant mecA/C and MREJ NOT DETECTED NOT DETECTED Final  ?  Comment: Performed at [facility], [address], [place],  [address]  ?Resp Panel by RT-PCR (Flu A&B, Covid) Nasopharyngeal Swab     Status: None  ? Collection Time: 05/12/21 10:56  PM  ? Specimen: Nasopharyngeal Swab; Nasopharyngeal(NP) swabs in vial transport medium  ?Result Value Ref Range Status  ? SARS Coronavirus 2 by RT PCR NEGATIVE NEGATIVE Final  ?  Comment: (NOTE) ?SARS-CoV-2 target nucleic acids are NOT DETECTED. ? ?The SARS-CoV-2 RNA is generally detectable in upper respiratory ?specimens during the acute phase of infection. The lowest ?concentration of SARS-CoV-2 viral copies this assay can detect is ?138 copies/mL. A negative result does not preclude SARS-Cov-2 ?infection and should not be used as the sole basis for treatment or ?other patient management decisions. A negative result may occur with  ?improper specimen collection/handling, submission of specimen other ?than nasopharyngeal swab, presence of viral mutation(s) within the ?areas targeted by this assay, and inadequate number of viral ?copies(<138 copies/mL). A negative result must be combined with ?clinical observations, patient history, and epidemiological ?information. The expected  result is Negative. ? ?Fact Sheet for Patients:  ?EntrepreneurPulse.com.au ? ?Fact Sheet for Healthcare Providers:  ?IncredibleEmployment.be ? ?This test is no t yet approved or cl

## 2021-05-16 NOTE — TOC CAGE-AID Note (Signed)
Transition of Care (TOC) - CAGE-AID Screening ? ? ?Patient Details  ?Name: Roberto Duran ?MRN: [id number] ?Date of Birth: [birth year] ? ?Transition of Care (TOC) CM/SW Contact:    ?Sherine Cortese C Tarpley-Carter, LCSWA ?Phone Number: ?05/16/2021, 2:45 PM ? ? ?Clinical Narrative: ?Pt refused assessment.  CSW has made several attempts to speak with pt.  CSW will attach resources for substance detected. ? ?Insurance underwriter, MSW, LCSW-A ?Pronouns:  She/Her/Hers ?[facility]Transitions of Care ?Clinical Social Worker ?Direct Number:  [phone] ?[email or web address]  ? ? ?CAGE-AID Screening: ?Substance Abuse Screening unable to be completed due to: : Patient Refused ? ?  ?  ?  ?  ?  ? ?Substance Abuse Education Offered: Yes ? ?Substance abuse interventions: Educational Materials ? ? ? ? ? ? ?

## 2021-05-17 DIAGNOSIS — A4101 Sepsis due to Methicillin susceptible Staphylococcus aureus: Secondary | ICD-10-CM | POA: Diagnosis not present

## 2021-05-17 LAB — BASIC METABOLIC PANEL
Anion gap: 12 (ref 5–15)
BUN: 7 mg/dL (ref 6–20)
CO2: 26 mmol/L (ref 22–32)
Calcium: 9.1 mg/dL (ref 8.9–10.3)
Chloride: 97 mmol/L — ABNORMAL LOW (ref 98–111)
Creatinine, Ser: 0.62 mg/dL (ref 0.61–1.24)
GFR, Estimated: >60 mL/min (ref >60)
Glucose, Bld: 140 mg/dL — ABNORMAL HIGH (ref 70–99)
Potassium: 3.8 mmol/L (ref 3.5–5.1)
Sodium: 135 mmol/L (ref 135–145)
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
## 2021-05-18 DIAGNOSIS — A4101 Sepsis due to Methicillin susceptible Staphylococcus aureus: Secondary | ICD-10-CM | POA: Diagnosis not present

## 2021-05-18 LAB — CULTURE, BLOOD (ROUTINE X 2)
Culture: NO GROWTH
Special Requests: ADEQUATE
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
## 2021-05-18 NOTE — Progress Notes (Signed)
Mobility Specialist: Progress Note ? ? 05/18/21 1727  ?Mobility  ?Activity Ambulated independently in hallway  ?Level of Assistance Independent  ?Assistive Device  ?(IV pole)  ?Distance Ambulated (ft) 160 ft  ?Activity Response Tolerated well  ?$Mobility charge 1 Mobility  ? ?Pt received in bed and agreeable to ambulation. C/o neck pain, no rating given, as well as feeling light headed halfway through session limiting distance. Pt sitting EOB after walk, light headedness resolved quickly. Pt set up with his dinner with call bell at his side.  ? ?Cristal Deer Deserae Jennings ?Mobility Specialist ?Mobility Specialist [address]: [phone] ?Mobility Specialist 6 [address]: [phone] ? ?

## 2021-05-19 DIAGNOSIS — A4101 Sepsis due to Methicillin susceptible Staphylococcus aureus: Secondary | ICD-10-CM | POA: Diagnosis not present

## 2021-05-19 LAB — CULTURE, BLOOD (ROUTINE X 2)
Culture: NO GROWTH
Culture: NO GROWTH

## 2021-05-19 NOTE — Progress Notes (Signed)
Mobility Specialist: Progress Note ? ? 05/19/21 1513  ?Mobility  ?Activity Ambulated independently in hallway  ?Level of Assistance Independent  ?Assistive Device None  ?Distance Ambulated (ft) 1750 ft  ?Activity Response Tolerated well  ?$Mobility charge 1 Mobility  ? ?Post-Mobility: 114 HR ? ?Received pt in bed having no complaints and agreeable to mobility. Asymptomatic throughout ambulation, returned back to bed w/ call bell in reach and all needs met. ? ?Harrell Gave Cherrie Franca ?Mobility Specialist ?Mobility Specialist [place]: [phone] ?Mobility Specialist [place]: [phone] ? ?

## 2021-05-19 NOTE — Plan of Care (Signed)
?  Problem: Education: ?Goal: Knowledge of General Education information will improve ?Description: Including pain rating scale, medication(s)/side effects and non-pharmacologic comfort measures ?Outcome: Progressing ?  ?Problem: Health Behavior/Discharge Planning: ?Goal: Ability to manage health-related needs will improve ?Outcome: Progressing ?  ?Problem: Clinical Measurements: ?Goal: Ability to maintain clinical measurements within normal limits will improve ?Outcome: Progressing ?Goal: Will remain free from infection ?Outcome: Progressing ?Goal: Diagnostic test results will improve ?Outcome: Progressing ?Goal: Respiratory complications will improve ?Outcome: Progressing ?Goal: Cardiovascular complication will be avoided ?Outcome: Progressing ?  ?Problem: Nutrition: ?Goal: Adequate nutrition will be maintained ?Outcome: Progressing ?  ?Problem: Coping: ?Goal: Level of anxiety will decrease ?Outcome: Progressing ?  ?Problem: Elimination: ?Goal: Will not experience complications related to bowel motility ?Outcome: Progressing ?Goal: Will not experience complications related to urinary retention ?Outcome: Progressing ?  ?Problem: Pain Managment: ?Goal: General experience of comfort will improve ?Outcome: Not Progressing ?  ?Problem: Safety: ?Goal: Ability to remain free from injury will improve ?Outcome: Progressing ?  ?Problem: Skin Integrity: ?Goal: Risk for impaired skin integrity will decrease ?Outcome: Progressing ?  ?

## 2021-05-19 NOTE — Plan of Care (Signed)
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
## 2021-05-20 DIAGNOSIS — A4101 Sepsis due to Methicillin susceptible Staphylococcus aureus: Secondary | ICD-10-CM | POA: Diagnosis not present

## 2021-05-20 LAB — CBC WITH DIFFERENTIAL/PLATELET
Abs Immature Granulocytes: 0.14 10*3/uL — ABNORMAL HIGH (ref 0.00–0.07)
Basophils Absolute: 0.1 10*3/uL (ref 0.0–0.1)
Basophils Relative: 1 %
Eosinophils Absolute: 0.3 10*3/uL (ref 0.0–0.5)
Eosinophils Relative: 2 %
HCT: 43.4 % (ref 39.0–52.0)
Hemoglobin: 14.2 g/dL (ref 13.0–17.0)
Immature Granulocytes: 1 %
Lymphocytes Relative: 29 %
Lymphs Abs: 3.6 10*3/uL (ref 0.7–4.0)
MCH: 29.5 pg (ref 26.0–34.0)
MCHC: 32.7 g/dL (ref 30.0–36.0)
MCV: 90.2 fL (ref 80.0–100.0)
Monocytes Absolute: 1.9 10*3/uL — ABNORMAL HIGH (ref 0.1–1.0)
Monocytes Relative: 15 %
Neutro Abs: 6.5 10*3/uL (ref 1.7–7.7)
Neutrophils Relative %: 52 %
Platelets: 396 10*3/uL (ref 150–400)
RBC: 4.81 MIL/uL (ref 4.22–5.81)
RDW: 13.7 % (ref 11.5–15.5)
WBC: 12.5 10*3/uL — ABNORMAL HIGH (ref 4.0–10.5)
nRBC: 0 % (ref 0.0–0.2)
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
## 2021-05-20 NOTE — Progress Notes (Signed)
Dr. Maurice Small planning operative stabilization of C1-2 tomorrow. Will make NPO p MN tonight and hold Lovenox. ? ?Lisbeth Renshaw, MD ?[facility]  ?

## 2021-05-20 NOTE — Progress Notes (Signed)
Mobility Specialist: Progress Note ? ? 05/20/21 1746  ?Mobility  ?Activity Ambulated independently in hallway  ?Level of Assistance Independent  ?Assistive Device  ?(IV pole)  ?Distance Ambulated (ft) 1100 ft  ?Activity Response Tolerated well  ?$Mobility charge 1 Mobility  ? ?Received pt in bed having no complaints and agreeable to mobility after going to BR. Asymptomatic throughout ambulation, returned back to bed w/ call bell in reach and all needs met. ? ?Harrell Gave Ksenia Kunz ?Mobility Specialist ?Mobility Specialist [place]: [phone] ?Mobility Specialist [place]: [phone] ? ?

## 2021-05-20 NOTE — Plan of Care (Signed)
  Problem: Activity: Goal: Risk for activity intolerance will decrease Outcome: Progressing   Problem: Nutrition: Goal: Adequate nutrition will be maintained Outcome: Progressing   Problem: Coping: Goal: Level of anxiety will decrease Outcome: Progressing   

## 2021-05-21 ENCOUNTER — Other Ambulatory Visit: Payer: Self-pay

## 2021-05-21 ENCOUNTER — Inpatient Hospital Stay (HOSPITAL_COMMUNITY): Payer: 59

## 2021-05-21 ENCOUNTER — Ambulatory Visit (HOSPITAL_COMMUNITY): Admission: RE | Admit: 2021-05-21 | Payer: 59 | Source: Home / Self Care | Admitting: Neurological Surgery

## 2021-05-21 ENCOUNTER — Encounter (HOSPITAL_COMMUNITY)
Admission: EM | Disposition: A | Discharge status: Home / Self Care | Payer: Self-pay | Source: Home / Self Care | Attending: Internal Medicine

## 2021-05-21 ENCOUNTER — Encounter (HOSPITAL_COMMUNITY): Payer: Self-pay | Admitting: Internal Medicine

## 2021-05-21 ENCOUNTER — Inpatient Hospital Stay (HOSPITAL_COMMUNITY): Payer: 59 | Admitting: Anesthesiology

## 2021-05-21 DIAGNOSIS — A4101 Sepsis due to Methicillin susceptible Staphylococcus aureus: Secondary | ICD-10-CM | POA: Diagnosis not present

## 2021-05-21 DIAGNOSIS — S1202XA Unstable burst fracture of first cervical vertebra, initial encounter for closed fracture: Secondary | ICD-10-CM

## 2021-05-21 HISTORY — PX: POSTERIOR CERVICAL FUSION/FORAMINOTOMY: SHX5038

## 2021-05-21 LAB — ABO/RH: ABO/RH(D): O POS

## 2021-05-21 LAB — TYPE AND SCREEN
ABO/RH(D): O POS
Antibody Screen: NEGATIVE

## 2021-05-21 SURGERY — POSTERIOR CERVICAL FUSION/FORAMINOTOMY LEVEL 1
Anesthesia: General | Site: Spine Cervical

## 2021-05-21 MED ORDER — BACITRACIN ZINC 500 UNIT/GM EX OINT
TOPICAL_OINTMENT | CUTANEOUS | Status: AC
  Filled 2021-05-21: qty 28.35

## 2021-05-21 MED ORDER — PHENYLEPHRINE HCL-NACL 20-0.9 MG/250ML-% IV SOLN
INTRAVENOUS | Status: DC | PRN
  Administered 2021-05-21: 40 ug/min via INTRAVENOUS

## 2021-05-21 MED ORDER — LIDOCAINE-EPINEPHRINE 1 %-1:100000 IJ SOLN
INTRAMUSCULAR | Status: AC
  Filled 2021-05-21: qty 1

## 2021-05-21 MED ORDER — OXYCODONE HCL 5 MG PO TABS: 5.0000 mg | ORAL_TABLET | ORAL | Status: DC | PRN

## 2021-05-21 MED ORDER — LACTATED RINGERS IV SOLN: INTRAVENOUS | Status: DC

## 2021-05-21 MED ORDER — OXYCODONE HCL 5 MG/5ML PO SOLN: 5.0000 mg | Freq: Once | ORAL | Status: DC | PRN

## 2021-05-21 MED ORDER — HYDROMORPHONE HCL 1 MG/ML IJ SOLN
1.0000 mg | INTRAMUSCULAR | Status: DC | PRN
  Administered 2021-05-21 – 2021-05-24 (×12): 1 mg via INTRAVENOUS
  Filled 2021-05-21 (×13): qty 1

## 2021-05-21 MED ORDER — DEXAMETHASONE SODIUM PHOSPHATE 10 MG/ML IJ SOLN
INTRAMUSCULAR | Status: AC
  Filled 2021-05-21: qty 1

## 2021-05-21 MED ORDER — ONDANSETRON HCL 4 MG/2ML IJ SOLN: 4.0000 mg | Freq: Four times a day (QID) | INTRAMUSCULAR | Status: DC | PRN

## 2021-05-21 MED ORDER — ROCURONIUM BROMIDE 10 MG/ML (PF) SYRINGE
PREFILLED_SYRINGE | INTRAVENOUS | Status: DC | PRN
Start: 2021-05-21 — End: 2021-05-21
  Administered 2021-05-21: 20 mg via INTRAVENOUS
  Administered 2021-05-21 (×2): 40 mg via INTRAVENOUS

## 2021-05-21 MED ORDER — MIDAZOLAM HCL 2 MG/2ML IJ SOLN
INTRAMUSCULAR | Status: AC
  Filled 2021-05-21: qty 2

## 2021-05-21 MED ORDER — CHLORHEXIDINE GLUCONATE CLOTH 2 % EX PADS: 6.0000 | MEDICATED_PAD | Freq: Once | CUTANEOUS | Status: DC

## 2021-05-21 MED ORDER — SUCCINYLCHOLINE CHLORIDE 200 MG/10ML IV SOSY
PREFILLED_SYRINGE | INTRAVENOUS | Status: DC | PRN
  Administered 2021-05-21: 140 mg via INTRAVENOUS

## 2021-05-21 MED ORDER — ORAL CARE MOUTH RINSE: 15.0000 mL | Freq: Once | OROMUCOSAL | Status: AC

## 2021-05-21 MED ORDER — THROMBIN 5000 UNITS EX SOLR
CUTANEOUS | Status: AC
  Filled 2021-05-21: qty 5000

## 2021-05-21 MED ORDER — SUGAMMADEX SODIUM 200 MG/2ML IV SOLN
INTRAVENOUS | Status: DC | PRN
Start: 2021-05-21 — End: 2021-05-21
  Administered 2021-05-21: 200 mg via INTRAVENOUS

## 2021-05-21 MED ORDER — DEXAMETHASONE SODIUM PHOSPHATE 10 MG/ML IJ SOLN
INTRAMUSCULAR | Status: DC | PRN
Start: 2021-05-21 — End: 2021-05-21
  Administered 2021-05-21: 10 mg via INTRAVENOUS

## 2021-05-21 MED ORDER — OXYCODONE HCL 5 MG PO TABS
10.0000 mg | ORAL_TABLET | ORAL | Status: AC | PRN
  Administered 2021-05-22 – 2021-05-26 (×11): 10 mg via ORAL
  Filled 2021-05-21 (×9): qty 2

## 2021-05-21 MED ORDER — 0.9 % SODIUM CHLORIDE (POUR BTL) OPTIME
TOPICAL | Status: DC | PRN
  Administered 2021-05-21: 1000 mL

## 2021-05-21 MED ORDER — PROPOFOL 10 MG/ML IV BOLUS
INTRAVENOUS | Status: AC
  Filled 2021-05-21: qty 20

## 2021-05-21 MED ORDER — PHENYLEPHRINE 40 MCG/ML (10ML) SYRINGE FOR IV PUSH (FOR BLOOD PRESSURE SUPPORT)
PREFILLED_SYRINGE | INTRAVENOUS | Status: DC | PRN
Start: 2021-05-21 — End: 2021-05-21
  Administered 2021-05-21: 40 ug via INTRAVENOUS

## 2021-05-21 MED ORDER — PHENYLEPHRINE HCL-NACL 20-0.9 MG/250ML-% IV SOLN
INTRAVENOUS | Status: AC
  Filled 2021-05-21: qty 250

## 2021-05-21 MED ORDER — FENTANYL CITRATE (PF) 100 MCG/2ML IJ SOLN
INTRAMUSCULAR | Status: AC
  Filled 2021-05-21: qty 2

## 2021-05-21 MED ORDER — PROPOFOL 10 MG/ML IV BOLUS
INTRAVENOUS | Status: DC | PRN
  Administered 2021-05-21: 50 mg via INTRAVENOUS
  Administered 2021-05-21: 200 mg via INTRAVENOUS

## 2021-05-21 MED ORDER — KETAMINE HCL 50 MG/5ML IJ SOSY
PREFILLED_SYRINGE | INTRAMUSCULAR | Status: AC
  Filled 2021-05-21: qty 5

## 2021-05-21 MED ORDER — LIDOCAINE 2% (20 MG/ML) 5 ML SYRINGE
INTRAMUSCULAR | Status: DC | PRN
  Administered 2021-05-21: 40 mg via INTRAVENOUS

## 2021-05-21 MED ORDER — HYDROMORPHONE HCL 1 MG/ML IJ SOLN
0.5000 mg | INTRAMUSCULAR | Status: DC | PRN
  Administered 2021-05-21: 0.5 mg via INTRAVENOUS
  Filled 2021-05-21: qty 0.5

## 2021-05-21 MED ORDER — LIDOCAINE 2% (20 MG/ML) 5 ML SYRINGE
INTRAMUSCULAR | Status: AC
  Filled 2021-05-21: qty 5

## 2021-05-21 MED ORDER — ONDANSETRON HCL 4 MG/2ML IJ SOLN
INTRAMUSCULAR | Status: AC
  Filled 2021-05-21: qty 2

## 2021-05-21 MED ORDER — OXYCODONE HCL 5 MG PO TABS: 5.0000 mg | ORAL_TABLET | Freq: Once | ORAL | Status: DC | PRN

## 2021-05-21 MED ORDER — LIDOCAINE-EPINEPHRINE 1 %-1:100000 IJ SOLN
INTRAMUSCULAR | Status: DC | PRN
  Administered 2021-05-21: 10 mL

## 2021-05-21 MED ORDER — BACITRACIN ZINC 500 UNIT/GM EX OINT
TOPICAL_OINTMENT | CUTANEOUS | Status: DC | PRN
  Administered 2021-05-21: 1 via TOPICAL

## 2021-05-21 MED ORDER — FENTANYL CITRATE (PF) 100 MCG/2ML IJ SOLN
INTRAMUSCULAR | Status: DC | PRN
  Administered 2021-05-21 (×2): 50 ug via INTRAVENOUS
  Administered 2021-05-21: 100 ug via INTRAVENOUS
  Administered 2021-05-21: 50 ug via INTRAVENOUS

## 2021-05-21 MED ORDER — MIDAZOLAM HCL 2 MG/2ML IJ SOLN
INTRAMUSCULAR | Status: DC | PRN
  Administered 2021-05-21: 2 mg via INTRAVENOUS

## 2021-05-21 MED ORDER — SUCCINYLCHOLINE CHLORIDE 200 MG/10ML IV SOSY
PREFILLED_SYRINGE | INTRAVENOUS | Status: AC
  Filled 2021-05-21: qty 10

## 2021-05-21 MED ORDER — THROMBIN 5000 UNITS EX SOLR
OROMUCOSAL | Status: DC | PRN
  Administered 2021-05-21: 5 mL via TOPICAL

## 2021-05-21 MED ORDER — KETAMINE HCL-SODIUM CHLORIDE 100-0.9 MG/10ML-% IV SOSY
PREFILLED_SYRINGE | INTRAVENOUS | Status: DC | PRN
  Administered 2021-05-21: 30 mg via INTRAVENOUS
  Administered 2021-05-21 (×2): 10 mg via INTRAVENOUS

## 2021-05-21 MED ORDER — ONDANSETRON HCL 4 MG/2ML IJ SOLN
INTRAMUSCULAR | Status: DC | PRN
  Administered 2021-05-21: 4 mg via INTRAVENOUS

## 2021-05-21 MED ORDER — CHLORHEXIDINE GLUCONATE 0.12 % MT SOLN
15.0000 mL | Freq: Once | OROMUCOSAL | Status: AC
  Administered 2021-05-21: 15 mL via OROMUCOSAL
  Filled 2021-05-21: qty 15

## 2021-05-21 MED ORDER — FENTANYL CITRATE (PF) 250 MCG/5ML IJ SOLN
INTRAMUSCULAR | Status: AC
  Filled 2021-05-21: qty 5

## 2021-05-21 MED ORDER — FENTANYL CITRATE (PF) 100 MCG/2ML IJ SOLN
25.0000 ug | INTRAMUSCULAR | Status: DC | PRN
  Administered 2021-05-21 (×2): 50 ug via INTRAVENOUS

## 2021-05-21 MED ORDER — ROCURONIUM BROMIDE 10 MG/ML (PF) SYRINGE
PREFILLED_SYRINGE | INTRAVENOUS | Status: AC
  Filled 2021-05-21: qty 10

## 2021-05-21 SURGICAL SUPPLY — 45 items
BAG COUNTER SPONGE SURGICOUNT (BAG) ×3 IMPLANT
BLADE CLIPPER SURG (BLADE) ×2 IMPLANT
BONE CANC CHIPS 20CC PCAN1/4 (Bone Implant) ×2 IMPLANT
BUR MATCHSTICK NEURO 3.0 LAGG (BURR) IMPLANT
BUR PRECISION FLUTE 5.0 (BURR) ×2 IMPLANT
CANISTER SUCT 3000ML PPV (MISCELLANEOUS) ×2 IMPLANT
CHIPS CANC BONE 20CC PCAN1/4 (Bone Implant) ×1 IMPLANT
DERMABOND ADVANCED (GAUZE/BANDAGES/DRESSINGS) ×1
DERMABOND ADVANCED .7 DNX12 (GAUZE/BANDAGES/DRESSINGS) ×1 IMPLANT
DRAPE C-ARM 42X72 X-RAY (DRAPES) ×4 IMPLANT
DRAPE LAPAROTOMY 100X72 PEDS (DRAPES) ×2 IMPLANT
DURAPREP 6ML APPLICATOR 50/CS (WOUND CARE) ×2 IMPLANT
ELECT REM PT RETURN 9FT ADLT (ELECTROSURGICAL) ×2
ELECTRODE REM PT RTRN 9FT ADLT (ELECTROSURGICAL) ×1 IMPLANT
GAUZE 4X4 16PLY ~~LOC~~+RFID DBL (SPONGE) ×1 IMPLANT
GLOVE SURG LTX SZ7.5 (GLOVE) ×2 IMPLANT
GLOVE SURG UNDER POLY LF SZ7.5 (GLOVE) ×2 IMPLANT
GOWN STRL REUS W/ TWL LRG LVL3 (GOWN DISPOSABLE) IMPLANT
GOWN STRL REUS W/ TWL XL LVL3 (GOWN DISPOSABLE) ×1 IMPLANT
GOWN STRL REUS W/TWL LRG LVL3 (GOWN DISPOSABLE) ×2
GOWN STRL REUS W/TWL XL LVL3 (GOWN DISPOSABLE) ×1
GRAFT BNE CANC CHIPS 1-8 20CC (Bone Implant) IMPLANT
HEMOSTAT POWDER KIT SURGIFOAM (HEMOSTASIS) ×2 IMPLANT
KIT BASIN OR (CUSTOM PROCEDURE TRAY) ×2 IMPLANT
KIT TURNOVER KIT B (KITS) ×2 IMPLANT
NEEDLE HYPO 22GX1.5 SAFETY (NEEDLE) ×2 IMPLANT
NS IRRIG 1000ML POUR BTL (IV SOLUTION) ×2 IMPLANT
PACK LAMINECTOMY NEURO (CUSTOM PROCEDURE TRAY) ×2 IMPLANT
PIN MAYFIELD SKULL DISP (PIN) ×2 IMPLANT
PUTTY DBM GRAFTON 5CC (Putty) ×1 IMPLANT
ROD PRE-CUT 3.5X30 (Rod) ×2 IMPLANT
SCREW MULTI AXIAL 3.5X14MM (Screw) ×2 IMPLANT
SCREW MULTI AXIAL 3.5X26MM (Screw) ×2 IMPLANT
SET SCREW INFINITY IFIX THOR (Screw) ×4 IMPLANT
SPONGE T-LAP 4X18 ~~LOC~~+RFID (SPONGE) ×1 IMPLANT
STAPLER VISISTAT 35W (STAPLE) ×2 IMPLANT
SUT MNCRL AB 3-0 PS2 18 (SUTURE) ×2 IMPLANT
SUT MON AB 3-0 SH 27 (SUTURE)
SUT MON AB 3-0 SH27 (SUTURE) ×1 IMPLANT
SUT VIC AB 0 CT1 18XCR BRD8 (SUTURE) ×1 IMPLANT
SUT VIC AB 0 CT1 8-18 (SUTURE) ×1
SUT VIC AB 2-0 CP2 18 (SUTURE) ×2 IMPLANT
TOWEL GREEN STERILE (TOWEL DISPOSABLE) ×2 IMPLANT
TOWEL GREEN STERILE FF (TOWEL DISPOSABLE) ×2 IMPLANT
WATER STERILE IRR 1000ML POUR (IV SOLUTION) ×2 IMPLANT

## 2021-05-21 NOTE — Plan of Care (Signed)
  Problem: Pain Managment: Goal: General experience of comfort will improve Outcome: Progressing   Problem: Safety: Goal: Ability to remain free from injury will improve Outcome: Progressing   Problem: Skin Integrity: Goal: Risk for impaired skin integrity will decrease Outcome: Progressing   

## 2021-05-21 NOTE — Progress Notes (Signed)
Pt returned to room 5N17 via bed after surgery. Received report from [place], RN in PACU. See reassessment. Will continue to monitor.  ?
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
## 2021-05-21 NOTE — Progress Notes (Signed)
Neurosurgery Service ?Post-operative progress note ? ?Assessment & Plan: ?41 y.o. man s/p C1-2 PSIF, seen in PACU, MAEx4 without preference, tongue midline. ? ?-ok to return to med/surg floor ?-preop orders restarted ?-hold SQH until POD2 (3/29) ?-advance diet as tolerated ?-advance activity as tolerated ?-pt does not need to wear cervical collar ? ?Maisie Fus A Brevan Luberto  ?05/21/21 ?5:08 PM ? ? ?

## 2021-05-21 NOTE — Anesthesia Procedure Notes (Signed)
Procedure Name: Intubation ?Date/Time: 05/21/2021 2:29 PM ?Performed by: Pearson Grippe, CRNA ?Pre-anesthesia Checklist: Patient identified, Emergency Drugs available, Suction available and Patient being monitored ?Patient Re-evaluated:Patient Re-evaluated prior to induction ?Oxygen Delivery Method: Circle system utilized ?Preoxygenation: Pre-oxygenation with 100% oxygen ?Induction Type: IV induction ?Ventilation: Mask ventilation without difficulty ?Laryngoscope Size: Glidescope and 4 ?Grade View: Grade I ?Tube type: Oral ?Tube size: 7.5 mm ?Number of attempts: 1 ?Airway Equipment and Method: Stylet, Oral airway and Video-laryngoscopy ?Placement Confirmation: ETT inserted through vocal cords under direct vision, positive ETCO2 and breath sounds checked- equal and bilateral ?Secured at: 22 cm ?Tube secured with: Tape ?Dental Injury: Teeth and Oropharynx as per pre-operative assessment  ?Difficulty Due To: Difficult Airway- due to cervical collar and Difficult Airway-  due to neck instability ?Comments: Elective glidescope intubation d/t cervical spine fracture & C-Collar. ? ? ? ? ?

## 2021-05-21 NOTE — Anesthesia Preprocedure Evaluation (Signed)
Anesthesia Evaluation  ?Patient identified by MRN, date of birth, ID band ?Patient awake ? ? ? ?Reviewed: ?Allergy & Precautions, H&P , NPO status , Patient's Chart, lab work & pertinent test results ? ?Airway ?Mallampati: III ? ? ?Neck ROM: limited ? ?Mouth opening: Limited Mouth Opening ?Comment: C-collar in place Dental ?  ?Pulmonary ?Current Smoker,  ?  ?breath sounds clear to auscultation ? ? ? ? ? ? Cardiovascular ?hypertension,  ?Rhythm:regular Rate:Normal ? ? ?  ?Neuro/Psych ?C2 dens fx ?  ? GI/Hepatic ?(+)  ?  ? substance abuse ? methamphetamine use and IV drug use,   ?Endo/Other  ? ? Renal/GU ?  ? ?  ?Musculoskeletal ? ?(+) narcotic dependent ? Abdominal ?  ?Peds ? Hematology ?  ?Anesthesia Other Findings ? ? Reproductive/Obstetrics ? ?  ? ? ? ? ? ? ? ? ? ? ? ? ? ?  ?  ? ? ? ? ? ? ? ? ?Anesthesia Physical ?Anesthesia Plan ? ?ASA: 3 ? ?Anesthesia Plan: General  ? ?Post-op Pain Management:   ? ?Induction: Intravenous ? ?PONV Risk Score and Plan: 1 and Ondansetron, Dexamethasone, Midazolam and Treatment may vary due to age or medical condition ? ?Airway Management Planned: Oral ETT and Video Laryngoscope Planned ? ?Additional Equipment:  ? ?Intra-op Plan:  ? ?Post-operative Plan: Extubation in OR ? ?Informed Consent: I have reviewed the patients History and Physical, chart, labs and discussed the procedure including the risks, benefits and alternatives for the proposed anesthesia with the patient or authorized representative who has indicated his/her understanding and acceptance.  ? ? ? ?Dental advisory given ? ?Plan Discussed with: CRNA, Anesthesiologist and Surgeon ? ?Anesthesia Plan Comments:   ? ? ? ? ? ? ?Anesthesia Quick Evaluation ? ?

## 2021-05-21 NOTE — Progress Notes (Signed)
Neurosurgery Service ?Progress Note ? ?Subjective: No acute events overnight, still having severe neck pain, no new radicular symptoms  ? ?Objective: ?Vitals:  ? 05/21/21 0404 05/21/21 0723 05/21/21 1340 05/21/21 1345  ?BP: (!) 136/91 127/78 (!) 143/98   ?Pulse:  100 100   ?Resp: 16 17 17    ?Temp: 97.8 ?F (36.6 ?C) (!) 97.5 ?F (36.4 ?C) 98.2 ?F (36.8 ?C)   ?TempSrc: Oral Oral Oral   ?SpO2: 95% 94% 95%   ?Weight:    98.3 kg  ?Height:    6' 0.01" (1.829 m)  ? ? ?Physical Exam: ?Strength 5/5 x4 and SILTx4, +R hoffman's, none on left, no clonus ? ?Assessment & Plan: ?41 y.o. man w/ prior C1-2 frx, now admitted for bacteremia likely 2/2 IVDU, exam notable for new clonus.  ? ?-OR today for C1-2 PSIF ? ?42 A Shamina Etheridge  ?05/21/21 ?2:10 PM ? ?
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
## 2021-05-21 NOTE — Transfer of Care (Signed)
Immediate Anesthesia Transfer of Care Note ? ?Patient: Roberto Duran ? ?Procedure(s) Performed: CERVICAL ONE-TWO POSTERIOR INSTRUMENTED FUSION (Spine Cervical) ? ?Patient Location: PACU ? ?Anesthesia Type:General ? ?Level of Consciousness: awake and patient cooperative ? ?Airway & Oxygen Therapy: Patient Spontanous Breathing and Patient connected to face mask oxygen ? ?Post-op Assessment: Report given to RN and Post -op Vital signs reviewed and stable ? ?Post vital signs: Reviewed and stable ? ?Last Vitals:  ?Vitals Value Taken Time  ?BP 135/84 05/21/21 1719  ?Temp    ?Pulse 112 05/21/21 1721  ?Resp 18 05/21/21 1721  ?SpO2 100 % 05/21/21 1721  ?Vitals shown include unvalidated device data. ? ?Last Pain:  ?Vitals:  ? 05/21/21 1340  ?TempSrc: Oral  ?PainSc:   ?   ? ?Patients Stated Pain Goal: 4 (05/20/21 1111) ? ?Complications: No notable events documented. ?
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
## 2021-05-21 NOTE — Progress Notes (Signed)
Pre-op report called and given to Susan, RN in Short Stay. All questions answered to satisfaction. OR personnel to transport pt.  

## 2021-05-22 ENCOUNTER — Encounter (HOSPITAL_COMMUNITY): Payer: Self-pay | Admitting: Neurological Surgery

## 2021-05-22 DIAGNOSIS — A4101 Sepsis due to Methicillin susceptible Staphylococcus aureus: Secondary | ICD-10-CM | POA: Diagnosis not present

## 2021-05-22 MED ORDER — ENOXAPARIN SODIUM 40 MG/0.4ML IJ SOSY
40.0000 mg | PREFILLED_SYRINGE | INTRAMUSCULAR | Status: DC
  Administered 2021-05-22 – 2021-05-26 (×5): 40 mg via SUBCUTANEOUS
  Filled 2021-05-22 (×6): qty 0.4

## 2021-05-22 NOTE — Anesthesia Postprocedure Evaluation (Signed)
Anesthesia Post Note ? ?Patient: Roberto Duran ? ?Procedure(s) Performed: CERVICAL ONE-TWO POSTERIOR INSTRUMENTED FUSION (Spine Cervical) ? ?  ? ?Patient location during evaluation: PACU ?Anesthesia Type: General ?Level of consciousness: awake and alert ?Pain management: pain level controlled ?Vital Signs Assessment: post-procedure vital signs reviewed and stable ?Respiratory status: spontaneous breathing, nonlabored ventilation, respiratory function stable and patient connected to nasal cannula oxygen ?Cardiovascular status: blood pressure returned to baseline and stable ?Postop Assessment: no apparent nausea or vomiting ?Anesthetic complications: no ? ? ?No notable events documented. ? ?Last Vitals:  ?Vitals:  ? 05/22/21 0829 05/22/21 0903  ?BP: (!) 131/92 130/88  ?Pulse: (!) 101 99  ?Resp: (!) 21 18  ?Temp: 36.4 ?C 36.4 ?C  ?SpO2:    ?  ?Last Pain:  ?Vitals:  ? 05/22/21 0829  ?TempSrc: Oral  ?PainSc:   ? ? ?  ?  ?  ?  ?  ?  ? ?Suzette Battiest E ? ? ? ? ?
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
## 2021-05-22 NOTE — Plan of Care (Signed)
  Problem: Education: Goal: Knowledge of General Education information will improve Description: Including pain rating scale, medication(s)/side effects and non-pharmacologic comfort measures Outcome: Not Progressing   Problem: Health Behavior/Discharge Planning: Goal: Ability to manage health-related needs will improve Outcome: Not Progressing   Problem: Clinical Measurements: Goal: Ability to maintain clinical measurements within normal limits will improve Outcome: Not Progressing Goal: Will remain free from infection Outcome: Not Progressing Goal: Diagnostic test results will improve Outcome: Not Progressing Goal: Respiratory complications will improve Outcome: Not Progressing Goal: Cardiovascular complication will be avoided Outcome: Not Progressing   Problem: Activity: Goal: Risk for activity intolerance will decrease Outcome: Not Progressing   Problem: Nutrition: Goal: Adequate nutrition will be maintained Outcome: Not Progressing   Problem: Coping: Goal: Level of anxiety will decrease Outcome: Not Progressing   Problem: Elimination: Goal: Will not experience complications related to bowel motility Outcome: Not Progressing Goal: Will not experience complications related to urinary retention Outcome: Not Progressing   Problem: Pain Managment: Goal: General experience of comfort will improve Outcome: Not Progressing   

## 2021-05-22 NOTE — Progress Notes (Signed)
Neurosurgery Service ?Progress Note ? ?Subjective: No acute events overnight, neck pain improved from preop, no new radicular symptoms ? ?Objective: ?Vitals:  ? 05/21/21 1735 05/21/21 1750 05/21/21 1818 05/21/21 1839  ?BP: 136/80 (!) 142/74 139/81   ?Pulse: (!) 112 (!) 115  (!) 109  ?Resp: 11 18 20    ?Temp:  98.1 ?F (36.7 ?C) 98.1 ?F (36.7 ?C)   ?TempSrc:   Oral   ?SpO2: 95% 95% 95%   ?Weight:      ?Height:      ? ? ?Physical Exam: ?Strength 5/5 x4 and SILTx4, no hoffman's, no clonus ? ?Assessment & Plan: ?41 y.o. man w/ prior C1-2 frx, now admitted for bacteremia likely 2/2 IVDU, exam notable for new clonus. 3/27 s/p C1-2 PSIF ? ?-exam improved post-op, doing well ?-activity as tolerated, no cervical collar needed ?-hold DVT chemoprophylaxis until 3/29 ? ?Marcello Moores A Daja Shuping  ?05/22/21 ?7:44 AM ? ?

## 2021-05-22 NOTE — Progress Notes (Signed)
Mobility Specialist: Progress Note ? ? 05/22/21 1510  ?Mobility  ?Activity Ambulated independently in hallway  ?Level of Assistance Independent  ?Assistive Device  ?(IV pole)  ?Distance Ambulated (ft) 1100 ft  ?Activity Response Tolerated well  ?$Mobility charge 1 Mobility  ? ?Received pt in bed having no complaints and agreeable to mobility. Asymptomatic throughout ambulation, returned back to bed w/ call bell in reach and all needs met. Noted that pt's L hand IV was out, RN notified.  ? ?Harrell Gave Shahira Fiske ?Mobility Specialist ?Mobility Specialist [place]: [phone] ?Mobility Specialist [place]: [phone] ? ?
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
## 2021-05-23 LAB — CBC WITH DIFFERENTIAL/PLATELET
Abs Immature Granulocytes: 0.06 10*3/uL (ref 0.00–0.07)
Basophils Absolute: 0 10*3/uL (ref 0.0–0.1)
Basophils Relative: 0 %
Eosinophils Absolute: 0 10*3/uL (ref 0.0–0.5)
Eosinophils Relative: 0 %
HCT: 41.3 % (ref 39.0–52.0)
Hemoglobin: 13.8 g/dL (ref 13.0–17.0)
Immature Granulocytes: 0 %
Lymphocytes Relative: 20 %
Lymphs Abs: 3.1 10*3/uL (ref 0.7–4.0)
MCH: 30.3 pg (ref 26.0–34.0)
MCHC: 33.4 g/dL (ref 30.0–36.0)
MCV: 90.8 fL (ref 80.0–100.0)
Monocytes Absolute: 1.6 10*3/uL — ABNORMAL HIGH (ref 0.1–1.0)
Monocytes Relative: 11 %
Neutro Abs: 10.8 10*3/uL — ABNORMAL HIGH (ref 1.7–7.7)
Neutrophils Relative %: 69 %
Platelets: 429 10*3/uL — ABNORMAL HIGH (ref 150–400)
RBC: 4.55 MIL/uL (ref 4.22–5.81)
RDW: 13.9 % (ref 11.5–15.5)
WBC: 15.6 10*3/uL — ABNORMAL HIGH (ref 4.0–10.5)
nRBC: 0 % (ref 0.0–0.2)

## 2021-05-23 LAB — BASIC METABOLIC PANEL
Anion gap: 9 (ref 5–15)
BUN: 13 mg/dL (ref 6–20)
CO2: 30 mmol/L (ref 22–32)
Calcium: 9.3 mg/dL (ref 8.9–10.3)
Chloride: 98 mmol/L (ref 98–111)
Creatinine, Ser: 0.78 mg/dL (ref 0.61–1.24)
GFR, Estimated: >60 mL/min (ref >60)
Glucose, Bld: 102 mg/dL — ABNORMAL HIGH (ref 70–99)
Potassium: 4.3 mmol/L (ref 3.5–5.1)
Sodium: 137 mmol/L (ref 135–145)

## 2021-05-23 MED ORDER — SODIUM CHLORIDE 0.9 % IV SOLN: INTRAVENOUS | Status: DC

## 2021-05-23 MED ORDER — LISINOPRIL 20 MG PO TABS
20.0000 mg | ORAL_TABLET | Freq: Every day | ORAL | Status: DC
Start: 2021-05-23 — End: 2021-05-27
  Administered 2021-05-23 – 2021-05-27 (×5): 20 mg via ORAL
  Filled 2021-05-23 (×5): qty 1
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
## 2021-05-23 NOTE — Progress Notes (Signed)
Neurosurgery Service ?Progress Note ? ?Subjective: No acute events overnight, neck pain improved from preop, no new radicular symptoms ? ?Objective: ?Vitals:  ? 05/22/21 1610 05/22/21 2118 05/23/21 0522 05/23/21 9604  ?BP: 130/88 (!) 123/94 (!) 147/94 (!) 158/101  ?Pulse: 99 (!) 103 (!) 101 80  ?Resp: 18 18 18 16   ?Temp: 97.6 ?F (36.4 ?C) (!) 97.4 ?F (36.3 ?C) 98.2 ?F (36.8 ?C) 98 ?F (36.7 ?C)  ?TempSrc:  Oral Oral Oral  ?SpO2:  100% 100% 100%  ?Weight:      ?Height:      ? ? ?Physical Exam: ?Strength 5/5 x4 and SILTx4, no hoffman's, no clonus ? ?Assessment & Plan: ?41 y.o. man w/ prior C1-2 frx, now admitted for bacteremia likely 2/2 IVDU, exam notable for new clonus. 3/27 s/p C1-2 PSIF ? ?-exam improved post-op, doing well ?-activity as tolerated, no cervical collar needed ?-okay for DVT chemoprophylaxis ?-going for TEE today, okay to extend neck as needed now that hardware is in place. Just to be clear, he will have some decreased ROM compared to normal anatomy due to the hardware in place.  ? ?4/27  ?05/23/21 ?9:31 AM ? ?
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
## 2021-05-23 NOTE — Progress Notes (Signed)
Mobility Specialist: Progress Note ? ? 05/23/21 1620  ?Mobility  ?Activity Refused mobility  ? ?Attempted to see pt x2. Pt refused both times with c/o not feeling well. Will f/u as able. ? ?Cristal Deer Dorene Bruni ?Mobility Specialist ?Mobility Specialist 5 North: [phone] ?Mobility Specialist 6 North: [phone] ? ?

## 2021-05-23 NOTE — Progress Notes (Signed)
? ? ?  [facility] has been requested to perform a transesophageal echocardiogram on Roberto Duran for bacteremia.  Per chart review, patient is a 41 year old male with a PMH of ongoing polysubstance abuse, IVDU, heroin abuse. Patient presented to the ED on 3/18 for neck pain and stiffiness. Patient does have cervical fractures and was scheduled for surgery on 3/23. However, patient was found to have a fever while in the ED. Was admitted to the hospitalist service for treatment of MSSA bacteremia in the setting of IVDU. Repeat blood cultures were negative. Patient underwent a C1-2 posterior instrumented spinal fusion on 3.27. Per neurosurgery note on 3/29, "going for TEE today, okay to extend neck as needed now that hardware is in place. Just to be clear, he will have some decreased ROM compared to normal anatomy due to the hardware in place."   ? ? ?After careful review of history and examination, the risks and benefits of transesophageal echocardiogram have been explained including risks of esophageal damage, perforation (1:10,000 risk), bleeding, pharyngeal hematoma as well as other potential complications associated with conscious sedation including aspiration, arrhythmia, respiratory failure and death. Alternatives to treatment were discussed, questions were answered. Patient is willing to proceed.  ? ?Jonita Albee, PA-C ?05/23/2021 4:39 PM  ? ?
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
## 2021-05-24 ENCOUNTER — Encounter (HOSPITAL_COMMUNITY)
Admission: EM | Disposition: A | Discharge status: Home / Self Care | Payer: Self-pay | Source: Home / Self Care | Attending: Internal Medicine

## 2021-05-24 ENCOUNTER — Encounter (HOSPITAL_COMMUNITY): Payer: Self-pay | Admitting: Internal Medicine

## 2021-05-24 ENCOUNTER — Inpatient Hospital Stay (HOSPITAL_COMMUNITY): Payer: 59 | Admitting: Anesthesiology

## 2021-05-24 ENCOUNTER — Inpatient Hospital Stay (HOSPITAL_COMMUNITY): Payer: 59

## 2021-05-24 DIAGNOSIS — I10 Essential (primary) hypertension: Secondary | ICD-10-CM

## 2021-05-24 DIAGNOSIS — R7881 Bacteremia: Secondary | ICD-10-CM

## 2021-05-24 HISTORY — PX: TEE WITHOUT CARDIOVERSION: SHX5443

## 2021-05-24 LAB — CBC WITH DIFFERENTIAL/PLATELET
Abs Immature Granulocytes: 0.07 10*3/uL (ref 0.00–0.07)
Basophils Absolute: 0.1 10*3/uL (ref 0.0–0.1)
Basophils Relative: 0 %
Eosinophils Absolute: 0.1 10*3/uL (ref 0.0–0.5)
Eosinophils Relative: 0 %
HCT: 45.2 % (ref 39.0–52.0)
Hemoglobin: 15 g/dL (ref 13.0–17.0)
Immature Granulocytes: 0 %
Lymphocytes Relative: 23 %
Lymphs Abs: 3.7 10*3/uL (ref 0.7–4.0)
MCH: 29.6 pg (ref 26.0–34.0)
MCHC: 33.2 g/dL (ref 30.0–36.0)
MCV: 89.3 fL (ref 80.0–100.0)
Monocytes Absolute: 1.7 10*3/uL — ABNORMAL HIGH (ref 0.1–1.0)
Monocytes Relative: 10 %
Neutro Abs: 10.8 10*3/uL — ABNORMAL HIGH (ref 1.7–7.7)
Neutrophils Relative %: 67 %
Platelets: 635 10*3/uL — ABNORMAL HIGH (ref 150–400)
RBC: 5.06 MIL/uL (ref 4.22–5.81)
RDW: 13.7 % (ref 11.5–15.5)
WBC: 16.4 10*3/uL — ABNORMAL HIGH (ref 4.0–10.5)
nRBC: 0 % (ref 0.0–0.2)

## 2021-05-24 LAB — COMPREHENSIVE METABOLIC PANEL
ALT: 16 U/L (ref 0–44)
AST: 24 U/L (ref 15–41)
Albumin: 3.1 g/dL — ABNORMAL LOW (ref 3.5–5.0)
Alkaline Phosphatase: 93 U/L (ref 38–126)
Anion gap: 11 (ref 5–15)
BUN: 11 mg/dL (ref 6–20)
CO2: 27 mmol/L (ref 22–32)
Calcium: 9.8 mg/dL (ref 8.9–10.3)
Chloride: 98 mmol/L (ref 98–111)
Creatinine, Ser: 0.93 mg/dL (ref 0.61–1.24)
GFR, Estimated: >60 mL/min (ref >60)
Glucose, Bld: 132 mg/dL — ABNORMAL HIGH (ref 70–99)
Potassium: 4 mmol/L (ref 3.5–5.1)
Sodium: 136 mmol/L (ref 135–145)
Total Bilirubin: 0.5 mg/dL (ref 0.3–1.2)
Total Protein: 8.8 g/dL — ABNORMAL HIGH (ref 6.5–8.1)

## 2021-05-24 SURGERY — ECHOCARDIOGRAM, TRANSESOPHAGEAL
Anesthesia: Monitor Anesthesia Care

## 2021-05-24 MED ORDER — BUTAMBEN-TETRACAINE-BENZOCAINE 2-2-14 % EX AERO
INHALATION_SPRAY | CUTANEOUS | Status: DC | PRN
  Administered 2021-05-24: 2 via TOPICAL

## 2021-05-24 MED ORDER — HYDROMORPHONE HCL 1 MG/ML IJ SOLN
1.0000 mg | INTRAMUSCULAR | Status: DC | PRN
  Administered 2021-05-24: 1 mg via INTRAVENOUS
  Filled 2021-05-24 (×2): qty 1

## 2021-05-24 MED ORDER — PROPOFOL 10 MG/ML IV BOLUS
INTRAVENOUS | Status: DC | PRN
Start: 2021-05-24 — End: 2021-05-24
  Administered 2021-05-24 (×3): 50 mg via INTRAVENOUS

## 2021-05-24 MED ORDER — METOPROLOL TARTRATE 25 MG PO TABS
25.0000 mg | ORAL_TABLET | Freq: Two times a day (BID) | ORAL | Status: DC
Start: 2021-05-24 — End: 2021-05-27
  Administered 2021-05-24 – 2021-05-27 (×6): 25 mg via ORAL
  Filled 2021-05-24 (×6): qty 1

## 2021-05-24 MED ORDER — METOPROLOL TARTRATE 12.5 MG HALF TABLET: 12.5000 mg | ORAL_TABLET | Freq: Two times a day (BID) | ORAL | Status: DC

## 2021-05-24 MED ORDER — PROPOFOL 500 MG/50ML IV EMUL
INTRAVENOUS | Status: DC | PRN
  Administered 2021-05-24: 90 ug/kg/min via INTRAVENOUS

## 2021-05-24 NOTE — CV Procedure (Signed)
? ? ?  TRANSESOPHAGEAL ECHOCARDIOGRAM  ? ?NAME:  Roberto Duran    ?MRN: [id number] ?DOB:  1980/10/23    ?ADMIT DATE: 05/12/2021 ? ?INDICATIONS: ?Bacteremia ? ?PROCEDURE:  ? ?Informed consent was obtained prior to the procedure. The risks, benefits and alternatives for the procedure were discussed and the patient comprehended these risks.  Risks include, but are not limited to, cough, sore throat, vomiting, nausea, somnolence, esophageal and stomach trauma or perforation, bleeding, low blood pressure, aspiration, pneumonia, infection, trauma to the teeth and death.   ? ?Procedural time out performed. The oropharynx was anesthetized with viscous lidocaine. ? ?Anesthesia was administered by the anaesthesilogy team to achieve and maintain moderate to deep conscious sedation.  The patient's heart rate, blood pressure, and oxygen saturation were monitored continuously during the procedure. ? ?The transesophageal probe was inserted in the esophagus and stomach without difficulty and multiple views were obtained.  ? ?The patient tolerated the procedure well. ? ?COMPLICATIONS:   ? ?There were no immediate complications. ? ?KEY FINDINGS: ? ?Normal LVEF, No vegetations appreciated please refer to final report.  ?Full report to follow. ?Further management per primary team.  ? ?Thomasene Ripple, DO [facility]  [facility]  ?1:23 PM ?  ?

## 2021-05-24 NOTE — Anesthesia Preprocedure Evaluation (Addendum)
Anesthesia Evaluation  ?Patient identified by MRN, date of birth, ID band ?Patient awake ? ? ? ?Reviewed: ?Allergy & Precautions, H&P , NPO status , Patient's Chart, lab work & pertinent test results ? ?Airway ?Mallampati: III ? ? ?Neck ROM: limited ? ?Mouth opening: Limited Mouth Opening ?Comment: C-collar in place Dental ? ?(+) Poor Dentition ?  ?Pulmonary ?Current Smoker and Patient abstained from smoking.,  ?  ?breath sounds clear to auscultation ? ? ? ? ? ? Cardiovascular ?hypertension, Normal cardiovascular exam ?Rhythm:regular Rate:Normal ? ? ?  ?Neuro/Psych ?C2 dens fx ?negative neurological ROS ? negative psych ROS  ? GI/Hepatic ?negative GI ROS, Neg liver ROS, (+)  ?  ? substance abuse ? methamphetamine use and IV drug use,   ?Endo/Other  ?negative endocrine ROS ? Renal/GU ?negative Renal ROS  ?negative genitourinary ?  ?Musculoskeletal ?negative musculoskeletal ROS ?(+) narcotic dependent ? Abdominal ?Normal abdominal exam  (+)   ?Peds ? Hematology ?negative hematology ROS ?(+)   ?Anesthesia Other Findings ? ? Reproductive/Obstetrics ? ?  ? ? ? ? ? ? ? ? ? ? ? ? ? ?  ?  ? ? ? ? ? ? ? ? ?Anesthesia Physical ? ?Anesthesia Plan ? ?ASA: 3 ? ?Anesthesia Plan: MAC  ? ?Post-op Pain Management: Minimal or no pain anticipated  ? ?Induction: Intravenous ? ?PONV Risk Score and Plan: 0 and Propofol infusion and TIVA ? ?Airway Management Planned: Natural Airway and Mask ? ?Additional Equipment: None ? ?Intra-op Plan:  ? ?Post-operative Plan:  ? ?Informed Consent: I have reviewed the patients History and Physical, chart, labs and discussed the procedure including the risks, benefits and alternatives for the proposed anesthesia with the patient or authorized representative who has indicated his/her understanding and acceptance.  ? ? ? ? ? ?Plan Discussed with: CRNA ? ?Anesthesia Plan Comments:   ? ? ? ? ? ? ?Anesthesia Quick Evaluation ? ?

## 2021-05-24 NOTE — Transfer of Care (Signed)
Immediate Anesthesia Transfer of Care Note ? ?Patient: Roberto Duran ? ?Procedure(s) Performed: TRANSESOPHAGEAL ECHOCARDIOGRAM (TEE) ? ?Patient Location: PACU and Endoscopy Unit ? ?Anesthesia Type:MAC ? ?Level of Consciousness: drowsy ? ?Airway & Oxygen Therapy: Patient Spontanous Breathing and Patient connected to nasal cannula oxygen ? ?Post-op Assessment: Report given to RN and Post -op Vital signs reviewed and stable ? ?Post vital signs: Reviewed and stable ? ?Last Vitals:  ?Vitals Value Taken Time  ?BP    ?Temp    ?Pulse    ?Resp    ?SpO2    ? ? ?Last Pain:  ?Vitals:  ? 05/24/21 1231  ?TempSrc: Temporal  ?PainSc: 7   ?   ? ?Patients Stated Pain Goal: 0 (05/23/21 1743) ? ?Complications: No notable events documented. ?

## 2021-05-24 NOTE — Progress Notes (Signed)
?  Echocardiogram ?Echocardiogram Transesophageal has been performed. ? ?Delcie Roch ?05/24/2021, 1:47 PM ?

## 2021-05-24 NOTE — Anesthesia Postprocedure Evaluation (Signed)
Anesthesia Post Note ? ?Patient: Roberto Duran ? ?Procedure(s) Performed: TRANSESOPHAGEAL ECHOCARDIOGRAM (TEE) ? ?  ? ?Patient location during evaluation: Phase II ?Anesthesia Type: MAC ?Level of consciousness: awake ?Pain management: pain level controlled ?Vital Signs Assessment: post-procedure vital signs reviewed and stable ?Respiratory status: spontaneous breathing ?Cardiovascular status: stable ?Postop Assessment: no apparent nausea or vomiting ?Anesthetic complications: no ? ? ?No notable events documented. ? ?Last Vitals:  ?Vitals:  ? 05/24/21 1355 05/24/21 1417  ?BP: 108/60 97/71  ?Pulse: 85 92  ?Resp: 16 16  ?Temp:  36.4 ?C  ?SpO2: 95% 96%  ?  ?Last Pain:  ?Vitals:  ? 05/24/21 1417  ?TempSrc: Oral  ?PainSc:   ? ? ?  ?  ?  ?  ?  ?  ? ?Huston Foley ? ? ? ? ?

## 2021-05-24 NOTE — Interval H&P Note (Signed)
History and Physical Interval Note: ? ?05/24/2021 ?12:43 PM ? ?Roberto Duran  has presented today for surgery, with the diagnosis of BACTERIMIA; IV DRUG USE.  The various methods of treatment have been discussed with the patient and family. After consideration of risks, benefits and other options for treatment, the patient has consented to  Procedure(s): ?TRANSESOPHAGEAL ECHOCARDIOGRAM (TEE) (N/A) as a surgical intervention.  The patient's history has been reviewed, patient examined, no change in status, stable for surgery.  I have reviewed the patient's chart and labs.  Questions were answered to the patient's satisfaction.   ? ? ?Lavona Mound Nil Xiong ? ? ?

## 2021-05-24 NOTE — Plan of Care (Signed)
  Problem: Activity: Goal: Risk for activity intolerance will decrease Outcome: Progressing   Problem: Nutrition: Goal: Adequate nutrition will be maintained Outcome: Progressing   Problem: Coping: Goal: Level of anxiety will decrease Outcome: Progressing   Problem: Safety: Goal: Ability to remain free from injury will improve Outcome: Progressing   Problem: Skin Integrity: Goal: Risk for impaired skin integrity will decrease Outcome: Progressing   
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
## 2021-05-25 ENCOUNTER — Other Ambulatory Visit (HOSPITAL_COMMUNITY): Payer: Self-pay

## 2021-05-25 ENCOUNTER — Encounter (HOSPITAL_COMMUNITY): Payer: Self-pay | Admitting: Cardiology

## 2021-05-25 MED ORDER — LINEZOLID 600 MG PO TABS
600.0000 mg | ORAL_TABLET | Freq: Two times a day (BID) | ORAL | 0 refills | Status: AC
Start: 2021-05-28 — End: 2021-06-11
  Filled 2021-05-25: qty 28, 14d supply, fill #0

## 2021-05-25 MED ORDER — METHADONE HCL 10 MG PO TABS
15.0000 mg | ORAL_TABLET | Freq: Every day | ORAL | Status: DC
  Administered 2021-05-26 – 2021-05-27 (×2): 15 mg via ORAL
  Filled 2021-05-25 (×2): qty 2

## 2021-05-25 NOTE — Plan of Care (Signed)

## 2021-05-25 NOTE — Evaluation (Signed)
Patient excessively lethargic, shallow sluggish breathing, unable to make eye contact, pupils constricted non-reactive to light. Pain usually 10/10 per hourly round. No complaints of pain since start of shift. last PRN admin 1818. IVDU hx. Wife at bedside. Leaves abruptly following focused neuro assessment. Reuel Boom, MD notified of incident.  ?

## 2021-05-25 NOTE — Progress Notes (Signed)
Mobility Specialist Progress Note  ? ? 05/25/21 1327  ?Mobility  ?Activity Ambulated independently in hallway  ?Level of Assistance Independent  ?Assistive Device None  ?Distance Ambulated (ft) 1000 ft  ?Activity Response Tolerated well  ?$Mobility charge 1 Mobility  ? ?Pt received in bed and agreeable. C/o some neck pain upon return, RN notified. Left sitting on bed with call bell in reach and family present.  ? ?[place] ?Mobility Specialist  ?  ?
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
## 2021-05-27 MED ORDER — LISINOPRIL 20 MG PO TABS: 20.0000 mg | ORAL_TABLET | Freq: Every day | ORAL | 1 refills | Status: DC

## 2021-05-27 MED ORDER — METHADONE HCL 5 MG PO TABS: 15.0000 mg | ORAL_TABLET | Freq: Every day | ORAL | 0 refills | Status: AC

## 2021-05-27 MED ORDER — AMLODIPINE BESYLATE 10 MG PO TABS: 10.0000 mg | ORAL_TABLET | Freq: Every day | ORAL | 2 refills | Status: DC

## 2021-05-27 MED ORDER — METOPROLOL TARTRATE 25 MG PO TABS: 25.0000 mg | ORAL_TABLET | Freq: Two times a day (BID) | ORAL | 2 refills | Status: DC
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
## 2021-05-27 NOTE — Plan of Care (Signed)
  Problem: Health Behavior/Discharge Planning: Goal: Ability to manage health-related needs will improve Outcome: Progressing   Problem: Clinical Measurements: Goal: Will remain free from infection Outcome: Progressing   

## 2021-06-15 ENCOUNTER — Inpatient Hospital Stay: Payer: 59 | Admitting: Internal Medicine

## 2021-08-10 ENCOUNTER — Emergency Department (HOSPITAL_COMMUNITY): Payer: Self-pay

## 2021-08-10 ENCOUNTER — Other Ambulatory Visit: Payer: Self-pay

## 2021-08-10 ENCOUNTER — Emergency Department (HOSPITAL_COMMUNITY)
Admission: EM | Admit: 2021-08-10 | Discharge: 2021-08-10 | Disposition: A | Discharge status: Home / Self Care | Payer: Self-pay | Attending: Emergency Medicine | Admitting: Emergency Medicine

## 2021-08-10 ENCOUNTER — Encounter (HOSPITAL_COMMUNITY): Payer: Self-pay

## 2021-08-10 DIAGNOSIS — Z008 Encounter for other general examination: Secondary | ICD-10-CM

## 2021-08-10 DIAGNOSIS — D72829 Elevated white blood cell count, unspecified: Secondary | ICD-10-CM | POA: Diagnosis not present

## 2021-08-10 DIAGNOSIS — Z79899 Other long term (current) drug therapy: Secondary | ICD-10-CM | POA: Insufficient documentation

## 2021-08-10 DIAGNOSIS — I10 Essential (primary) hypertension: Secondary | ICD-10-CM | POA: Insufficient documentation

## 2021-08-10 DIAGNOSIS — R4182 Altered mental status, unspecified: Secondary | ICD-10-CM | POA: Diagnosis not present

## 2021-08-10 LAB — CBC WITH DIFFERENTIAL/PLATELET
Abs Immature Granulocytes: 0.02 10*3/uL (ref 0.00–0.07)
Basophils Absolute: 0 10*3/uL (ref 0.0–0.1)
Basophils Relative: 0 %
Eosinophils Absolute: 0 10*3/uL (ref 0.0–0.5)
Eosinophils Relative: 0 %
HCT: 43.3 % (ref 39.0–52.0)
Hemoglobin: 14.2 g/dL (ref 13.0–17.0)
Immature Granulocytes: 0 %
Lymphocytes Relative: 13 %
Lymphs Abs: 1.4 10*3/uL (ref 0.7–4.0)
MCH: 29.8 pg (ref 26.0–34.0)
MCHC: 32.8 g/dL (ref 30.0–36.0)
MCV: 91 fL (ref 80.0–100.0)
Monocytes Absolute: 0.6 10*3/uL (ref 0.1–1.0)
Monocytes Relative: 6 %
Neutro Abs: 9 10*3/uL — ABNORMAL HIGH (ref 1.7–7.7)
Neutrophils Relative %: 81 %
Platelets: 312 10*3/uL (ref 150–400)
RBC: 4.76 MIL/uL (ref 4.22–5.81)
RDW: 14 % (ref 11.5–15.5)
WBC: 11.1 10*3/uL — ABNORMAL HIGH (ref 4.0–10.5)
nRBC: 0 % (ref 0.0–0.2)

## 2021-08-10 LAB — COMPREHENSIVE METABOLIC PANEL
ALT: 29 U/L (ref 0–44)
AST: 33 U/L (ref 15–41)
Albumin: 4.3 g/dL (ref 3.5–5.0)
Alkaline Phosphatase: 136 U/L — ABNORMAL HIGH (ref 38–126)
Anion gap: 10 (ref 5–15)
BUN: 26 mg/dL — ABNORMAL HIGH (ref 6–20)
CO2: 26 mmol/L (ref 22–32)
Calcium: 9.9 mg/dL (ref 8.9–10.3)
Chloride: 109 mmol/L (ref 98–111)
Creatinine, Ser: 1.11 mg/dL (ref 0.61–1.24)
GFR, Estimated: >60 mL/min (ref >60)
Glucose, Bld: 95 mg/dL (ref 70–99)
Potassium: 3.9 mmol/L (ref 3.5–5.1)
Sodium: 145 mmol/L (ref 135–145)
Total Bilirubin: 1 mg/dL (ref 0.3–1.2)
Total Protein: 8.5 g/dL — ABNORMAL HIGH (ref 6.5–8.1)

## 2021-08-10 LAB — ACETAMINOPHEN LEVEL: Acetaminophen (Tylenol), Serum: <10 ug/mL — ABNORMAL LOW (ref 10–30)

## 2021-08-10 LAB — CBG MONITORING, ED: Glucose-Capillary: 83 mg/dL (ref 70–99)

## 2021-08-10 LAB — SALICYLATE LEVEL: Salicylate Lvl: <7 mg/dL — ABNORMAL LOW (ref 7.0–30.0)

## 2021-08-10 LAB — ETHANOL: Alcohol, Ethyl (B): <10 mg/dL (ref <10)

## 2021-08-10 NOTE — Discharge Instructions (Signed)
Roberto Duran had an unremarkable work-up in the ER today.  He is therefore medically clear to proceed to jail.   Return if development of any new or worsening symptoms.

## 2021-08-10 NOTE — ED Provider Notes (Signed)
[facility]-EMERGENCY DEPT Provider Note   CSN: [id number] Arrival date & time: 08/10/21  1553     History {Add pertinent medical, surgical, social history, OB history to HPI:1} No chief complaint on file.   Roberto Duran is a 41 y.o. male.  Patient with history of IVDU, MSSA, and hypertension presents today in police custody for medical clearance to go to jail. According to police officer present in the room, the patient was found passed out under a tree earlier today. Given that he was minimally responsive, police brought him here for evaluation. Patient is able to follow commands slowly and is able to mumble his name and birthday. He denies any complaints at this time.  The history is provided by the patient. No language interpreter was used.       Home Medications Prior to Admission medications   Medication Sig Start Date End Date Taking? Authorizing Provider  amLODipine (NORVASC) 10 MG tablet Take 1 tablet (10 mg total) by mouth daily. 05/27/21   Rhetta Mura, MD  lisinopril (ZESTRIL) 20 MG tablet Take 1 tablet (20 mg total) by mouth daily. 05/27/21   Rhetta Mura, MD  metoprolol tartrate (LOPRESSOR) 25 MG tablet Take 1 tablet (25 mg total) by mouth 2 (two) times daily. 05/27/21   Rhetta Mura, MD      Allergies    Penicillins and Prochlorperazine edisylate    Review of Systems   Review of Systems  All other systems reviewed and are negative.   Physical Exam Updated Vital Signs BP 121/87 (BP Location: Right Arm)   Pulse 96   Temp 98 F (36.7 C) (Oral)   Resp 18   Ht 6' (1.829 m)   Wt 99 kg   SpO2 96%   BMI 29.60 kg/m  Physical Exam Vitals and nursing note reviewed.  Constitutional:      General: He is not in acute distress.    Appearance: Normal appearance. He is normal weight. He is not ill-appearing, toxic-appearing or diaphoretic.  HENT:     Head: Normocephalic and atraumatic.  Eyes:     Extraocular Movements:  Extraocular movements intact.     Pupils: Pupils are equal, round, and reactive to light.  Cardiovascular:     Rate and Rhythm: Normal rate and regular rhythm.     Heart sounds: Normal heart sounds.  Pulmonary:     Effort: Pulmonary effort is normal. No respiratory distress.     Breath sounds: Normal breath sounds.  Abdominal:     General: Abdomen is flat.     Palpations: Abdomen is soft.     Tenderness: There is no abdominal tenderness.  Musculoskeletal:        General: Normal range of motion.     Cervical back: Normal range of motion and neck supple. No tenderness.  Skin:    General: Skin is warm and dry.     Comments: Patient with track marks throughout his bilateral upper extremities non-infectious appearing  Neurological:     General: No focal deficit present.     Mental Status: He is alert and oriented to person, place, and time.     Comments: Patient with dilated pupils which are equal and reactive. He is following commands but is sluggish to do so. He is difficult to arouse but alert and oriented. No focal deficits.  Able to ambulate slowly with normal gait.   Psychiatric:        Mood and Affect: Mood normal.  Behavior: Behavior normal.     ED Results / Procedures / Treatments   Labs (all labs ordered are listed, but only abnormal results are displayed) Labs Reviewed  COMPREHENSIVE METABOLIC PANEL - Abnormal; Notable for the following components:      Result Value   BUN 26 (*)    Total Protein 8.5 (*)    Alkaline Phosphatase 136 (*)    All other components within normal limits  CBC WITH DIFFERENTIAL/PLATELET - Abnormal; Notable for the following components:   WBC 11.1 (*)    Neutro Abs 9.0 (*)    All other components within normal limits  ACETAMINOPHEN LEVEL - Abnormal; Notable for the following components:   Acetaminophen (Tylenol), Serum <10 (*)    All other components within normal limits  SALICYLATE LEVEL - Abnormal; Notable for the following  components:   Salicylate Lvl <7.0 (*)    All other components within normal limits  ETHANOL  RAPID URINE DRUG SCREEN, HOSP PERFORMED  CBG MONITORING, ED    EKG None  Radiology CT Head Wo Contrast  Result Date: 08/10/2021 CLINICAL DATA:  Altered mental status EXAM: CT HEAD WITHOUT CONTRAST TECHNIQUE: Contiguous axial images were obtained from the base of the skull through the vertex without intravenous contrast. RADIATION DOSE REDUCTION: This exam was performed according to the departmental dose-optimization program which includes automated exposure control, adjustment of the mA and/or kV according to patient size and/or use of iterative reconstruction technique. COMPARISON:  05/12/2021 FINDINGS: Brain: No evidence of acute infarction, hemorrhage, cerebral edema, mass, mass effect, or midline shift. No hydrocephalus or extra-axial fluid collection. Unchanged lateral ventricular asymmetry. Vascular: No hyperdense vessel. Skull: Normal. Negative for fracture or focal lesion. Sinuses/Orbits: No acute finding. Other: The mastoid air cells are well aerated. IMPRESSION: No acute intracranial process. No etiology is seen for the patient's altered mental status. Electronically Signed   By: Wiliam Ke M.D.   On: 08/10/2021 17:56    Procedures Procedures  {Document cardiac monitor, telemetry assessment procedure when appropriate:1}  Medications Ordered in ED Medications - No data to display  ED Course/ Medical Decision Making/ A&P                           Medical Decision Making Amount and/or Complexity of Data Reviewed Labs: ordered. Radiology: ordered.   This patient presents to the ED for concern of altered mental status, this involves an extensive number of treatment options, and is a complaint that carries with it a high risk of complications and morbidity.   Co morbidities that complicate the patient evaluation  Hx IVDU   Additional history obtained:  Additional history  obtained from previous ER notes   Lab Tests:  I Ordered, and personally interpreted labs.  The pertinent results include:    Imaging Studies ordered:  I ordered imaging studies including ***  I independently visualized and interpreted imaging which showed *** I agree with the radiologist interpretation   Cardiac Monitoring: / EKG:  The patient was maintained on a cardiac monitor.  I personally viewed and interpreted the cardiac monitored which showed an underlying rhythm of: ***   Consultations Obtained:  I requested consultation with the ***,  and discussed lab and imaging findings as well as pertinent plan - they recommend: ***   Problem List / ED Course / Critical interventions / Medication management  *** I ordered medication including ***  for ***  Reevaluation of the patient after these medicines  showed that the patient {resolved/improved/worsened:23923::"improved"} I have reviewed the patients home medicines and have made adjustments as needed   Social Determinants of Health:  ***   Test / Admission - Considered:  ***   {Document critical care time when appropriate:1} {Document review of labs and clinical decision tools ie heart score, Chads2Vasc2 etc:1}  {Document your independent review of radiology images, and any outside records:1} {Document your discussion with family members, caretakers, and with consultants:1} {Document social determinants of health affecting pt's care:1} {Document your decision making why or why not admission, treatments were needed:1} Final Clinical Impression(s) / ED Diagnoses Final diagnoses:  None    Rx / DC Orders ED Discharge Orders     None

## 2021-08-10 NOTE — ED Triage Notes (Signed)
Patient BIB sheriff for medial clearance for [place].

## 2022-04-26 ENCOUNTER — Ambulatory Visit: Payer: Medicaid Other | Admitting: Family Medicine

## 2022-04-29 ENCOUNTER — Ambulatory Visit: Payer: Medicaid Other | Admitting: Surgery

## 2022-04-30 ENCOUNTER — Encounter: Payer: Self-pay | Admitting: Urology

## 2022-04-30 ENCOUNTER — Encounter: Payer: Medicaid Other | Admitting: Urology

## 2022-05-01 ENCOUNTER — Ambulatory Visit: Payer: Medicaid Other | Admitting: Family Medicine

## 2022-05-02 ENCOUNTER — Ambulatory Visit: Payer: Medicaid Other | Admitting: Family Medicine

## 2022-05-03 ENCOUNTER — Ambulatory Visit: Payer: Medicaid Other | Admitting: Family

## 2022-05-07 ENCOUNTER — Ambulatory Visit: Payer: Medicaid Other | Admitting: Surgery

## 2022-05-09 ENCOUNTER — Encounter: Payer: Self-pay | Admitting: Urology

## 2022-05-09 ENCOUNTER — Encounter: Payer: BLUE CROSS/BLUE SHIELD | Admitting: Urology
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
## 2022-05-14 ENCOUNTER — Ambulatory Visit: Payer: Medicaid Other | Admitting: Family Medicine

## 2022-06-01 ENCOUNTER — Telehealth: Payer: BLUE CROSS/BLUE SHIELD | Admitting: Nurse Practitioner

## 2022-06-01 NOTE — Progress Notes (Signed)
No show Contacted patient by home at 8:35. I introduced myself and that I worked for [employer], and tha the had an appointment at 8:30. He hung up phone. Appointment was cancelled.  Mary-Margaret Daphine Deutscher, FNP

## 2022-06-03 ENCOUNTER — Ambulatory Visit: Payer: BLUE CROSS/BLUE SHIELD | Admitting: Family Medicine

## 2022-06-10 ENCOUNTER — Encounter: Payer: Self-pay | Admitting: *Deleted

## 2022-06-18 ENCOUNTER — Encounter: Payer: Medicaid Other | Admitting: Family

## 2022-06-18 ENCOUNTER — Encounter: Payer: BLUE CROSS/BLUE SHIELD | Admitting: Urology

## 2022-06-18 NOTE — Progress Notes (Deleted)
   Assessment: No diagnosis found.   Plan: ***  Chief Complaint: No chief complaint on file.   History of Present Illness:  Roberto Duran is a 42 y.o. male who is seen for evaluation of low testosterone and erectile dysfunction.   Past Medical History:  Past Medical History:  Diagnosis Date   Heroin abuse    IVDU (intravenous drug user)    Polysubstance abuse     Past Surgical History:  Past Surgical History:  Procedure Laterality Date   POSTERIOR CERVICAL FUSION/FORAMINOTOMY N/A 05/21/2021   Procedure: CERVICAL ONE-TWO POSTERIOR INSTRUMENTED FUSION;  Surgeon: Jadene Pierini, MD;  [facility];  Service: Neurosurgery;  Laterality: N/A;   TEE WITHOUT CARDIOVERSION N/A 05/24/2021   Procedure: TRANSESOPHAGEAL ECHOCARDIOGRAM (TEE);  Surgeon: Thomasene Ripple, DO;  [facility];  Service: Cardiovascular;  Laterality: N/A;    Allergies:  Allergies  Allergen Reactions   Penicillins Hives    bloating   Prochlorperazine Edisylate Other (See Comments)    Coming out of skin Compazine Hallucination    Family History:  No family history on file.  Social History:  Social History   Tobacco Use   Smoking status: Every Day    Types: Cigarettes  Substance Use Topics   Alcohol use: Yes    Comment: occasionally   Drug use: Yes    Types: Amphetamines, Heroin    Review of symptoms:  Constitutional:  Negative for unexplained weight loss, night sweats, fever, chills ENT:  Negative for nose bleeds, sinus pain, painful swallowing CV:  Negative for chest pain, shortness of breath, exercise intolerance, palpitations, loss of consciousness Resp:  Negative for cough, wheezing, shortness of breath GI:  Negative for nausea, vomiting, diarrhea, bloody stools GU:  Positives noted in HPI; otherwise negative for gross hematuria, dysuria, urinary incontinence Neuro:  Negative for seizures, poor balance, limb weakness, slurred speech Psych:  Negative for lack of energy,  depression, anxiety Endocrine:  Negative for polydipsia, polyuria, symptoms of hypoglycemia (dizziness, hunger, sweating) Hematologic:  Negative for anemia, purpura, petechia, prolonged or excessive bleeding, use of anticoagulants  Allergic:  Negative for difficulty breathing or choking as a result of exposure to anything; no shellfish allergy; no allergic response (rash/itch) to materials, foods  Physical exam: There were no vitals taken for this visit. GENERAL APPEARANCE:  Well appearing, well developed, well nourished, NAD HEENT: Atraumatic, Normocephalic, oropharynx clear. NECK: Supple without lymphadenopathy or thyromegaly. LUNGS: Clear to auscultation bilaterally. HEART: Regular Rate and Rhythm without murmurs, gallops, or rubs. ABDOMEN: Soft, non-tender, No Masses. EXTREMITIES: Moves all extremities well.  Without clubbing, cyanosis, or edema. NEUROLOGIC:  Alert and oriented x 3, normal gait, CN II-XII grossly intact.  MENTAL STATUS:  Appropriate. BACK:  Non-tender to palpation.  No CVAT SKIN:  Warm, dry and intact.    Results: No results found for this or any previous visit (from the past 24 hour(s)).

## 2022-06-18 NOTE — Progress Notes (Signed)
  This encounter was created in error - please disregard. No show 

## 2022-07-24 ENCOUNTER — Ambulatory Visit: Payer: BLUE CROSS/BLUE SHIELD | Admitting: Physician Assistant

## 2022-07-24 NOTE — Progress Notes (Deleted)
New patient visit  Patient: Roberto Duran   DOB: [birth year]   42 y.o. Male  MRN: [id number] Visit Date: 07/24/2022  Today's healthcare provider: Debera Lat, PA-C   No chief complaint on file.  Subjective    Roberto Duran is a 42 y.o. male who presents today as a new patient to establish care.  HPI  ***  Past Medical History:  Diagnosis Date   Heroin abuse (H[facility])    IVDU (intravenous drug user)    Polysubstance abuse [facility])    Past Surgical History:  Procedure Laterality Date   POSTERIOR CERVICAL FUSION/FORAMINOTOMY N/A 05/21/2021   Procedure: CERVICAL ONE-TWO POSTERIOR INSTRUMENTED FUSION;  Surgeon: Jadene Pierini, MD;  [facility];  Service: Neurosurgery;  Laterality: N/A;   TEE WITHOUT CARDIOVERSION N/A 05/24/2021   Procedure: TRANSESOPHAGEAL ECHOCARDIOGRAM (TEE);  Surgeon: Thomasene Ripple, DO;  [facility];  Service: Cardiovascular;  Laterality: N/A;   No family status information on file.   No family history on file. Social History   Socioeconomic History   Marital status: Married    Spouse name: Not on file   Number of children: 2   Years of education: Not on file   Highest education level: Some college, no degree  Occupational History   Not on file  Tobacco Use   Smoking status: Every Day    Types: Cigarettes   Smokeless tobacco: Not on file  Substance and Sexual Activity   Alcohol use: Yes    Comment: occasionally   Drug use: Yes    Types: Amphetamines, Heroin   Sexual activity: Not on file  Other Topics Concern   Not on file  Social History Narrative   Not on file   Social Determinants of Health   Financial Resource Strain: Not on file  Food Insecurity: Not on file  Transportation Needs: Not on file  Physical Activity: Not on file  Stress: Not on file  Social Connections: Not on file   Outpatient Medications Prior to Visit  Medication Sig   amLODipine (NORVASC) 10 MG tablet Take 1 tablet (10 mg total) by mouth daily.    lisinopril (ZESTRIL) 20 MG tablet Take 1 tablet (20 mg total) by mouth daily.   metoprolol tartrate (LOPRESSOR) 25 MG tablet Take 1 tablet (25 mg total) by mouth 2 (two) times daily.   No facility-administered medications prior to visit.   Allergies  Allergen Reactions   Penicillins Hives    bloating   Prochlorperazine Edisylate Other (See Comments)    Coming out of skin Compazine Hallucination    Immunization History  Administered Date(s) Administered   Tdap 02/05/2021    Health Maintenance  Topic Date Due   COVID-19 Vaccine (1) Never done   INFLUENZA VACCINE  09/26/2022   DTaP/Tdap/Td (2 - Td or Tdap) 02/06/2031   Hepatitis C Screening  Completed   HIV Screening  Completed   HPV VACCINES  Aged Out    Patient Care Team: Pcp, No as PCP - General  Review of Systems Except see HPI   {Labs  Heme  Chem  Endocrine  Serology  Results Review (optional):23779}   Objective    There were no vitals taken for this visit. {Show previous vital signs (optional):23777}  Physical Exam  Depression Screen     No data to display         No results found for any visits on 07/24/22.  Assessment & Plan     *** Encounter to establish care Welcomed  to our clinic Reviewed past medical hx, social hx, family hx and surgical hx Pt advised to send all vaccination records or screening   No follow-ups on file.    The patient was advised to call back or seek an in-person evaluation if the symptoms worsen or if the condition fails to improve as anticipated.  I discussed the assessment and treatment plan with the patient. The patient was provided an opportunity to ask questions and all were answered. The patient agreed with the plan and demonstrated an understanding of the instructions.  I, Debera Lat, PA-C have reviewed all documentation for this visit. The documentation on 07/24/22 for the exam, diagnosis, procedures, and orders are all accurate and complete.  Debera Lat, [facility], MMS [facility] [phone] (phone) [phone] (fax)  [facility]

## 2022-07-29 ENCOUNTER — Telehealth: Payer: Self-pay | Admitting: Urology

## 2022-08-06 ENCOUNTER — Telehealth: Payer: BLUE CROSS/BLUE SHIELD | Admitting: Nurse Practitioner

## 2022-08-06 ENCOUNTER — Encounter: Payer: BLUE CROSS/BLUE SHIELD | Admitting: Urology

## 2022-08-06 DIAGNOSIS — R21 Rash and other nonspecific skin eruption: Secondary | ICD-10-CM

## 2022-08-06 NOTE — Progress Notes (Signed)
  Thank you for the details you included in the comment boxes. Those details are very helpful in determining the best course of treatment for you and help [place] to provide the best care.Because of the extent if your rash, we recommend that you convert this visit to a video visit in order for the provider to better assess what is going on.  The provider will be able to give you a more accurate diagnosis and treatment plan if we can more freely discuss your symptoms and with the addition of a virtual examination.   If you convert to a video visit, we will bill your insurance (similar to an office visit) and you will not be charged for this e-Visit. You will be able to stay at home and speak with the first available [facility] advanced practice provider. The link to do a video visit is in the drop down Menu tab of your Welcome screen in MyChart.   You may also choose to be evaluated in person at an Urgent Care are here are some locations if that is easier for you    NOTE: There will be NO CHARGE for this eVisit   If you are having a true medical emergency please call 911.      For an urgent face to face visit, [place] has eight urgent care centers for your convenience:   NEW!! [facility] Urgent Care Center at [facility] Get Driving Directions [phone] [address], [address] [place], [address]    [facility] Urgent Care Center at [facility] Get Driving Directions [phone] [address] [place], [place] [address]   [facility] Urgent Care Center [facility]) Get Driving Directions [phone] [address] [place], [place] [address]  [facility] Health Urgent Care Center [facility]) Get Driving Directions [phone] [address] [place],  [place]  [address]  [facility] Urgent Care Center [facility] - at [employer]  [phone] [address] [place],  [place]  [address]   [facility] Health Urgent Care at [facility] Get Driving Directions [phone] [address] [place], [place] [address]   [facility] Health Urgent Care at [facility] Get Driving Directions  [phone] [address] [place], [place] [address]   [facility] Health Urgent Care at [facility] Directions [phone] [address], [address] [place], [place] [address]  Your MyChart E-visit questionnaire answers were reviewed by a board certified advanced clinical practitioner to complete your personal care plan based on your specific symptoms.  Thank you for using e-Visits.

## 2022-09-08 NOTE — Progress Notes (Deleted)
New patient visit  Patient: Roberto Duran   DOB: 07-04-1980   42 y.o. Male  MRN: [id number] Visit Date: 09/13/2022  Today's healthcare provider: Debera Lat, PA-C   No chief complaint on file.  Subjective    Roberto Duran is a 42 y.o. male who presents today as a new patient to establish care.  HPI  *** Discussed the use of AI scribe software for clinical note transcription with the patient, who gave verbal consent to proceed.  History of Present Illness            Past Medical History:  Diagnosis Date   Heroin abuse ([facility])    IVDU (intravenous drug user)    Polysubstance abuse [facility])    Past Surgical History:  Procedure Laterality Date   POSTERIOR CERVICAL FUSION/FORAMINOTOMY N/A 05/21/2021   Procedure: CERVICAL ONE-TWO POSTERIOR INSTRUMENTED FUSION;  Surgeon: Jadene Pierini, MD;  [facility];  Service: Neurosurgery;  Laterality: N/A;   TEE WITHOUT CARDIOVERSION N/A 05/24/2021   Procedure: TRANSESOPHAGEAL ECHOCARDIOGRAM (TEE);  Surgeon: Thomasene Ripple, DO;  [facility];  Service: Cardiovascular;  Laterality: N/A;   No family status information on file.   No family history on file. Social History   Socioeconomic History   Marital status: Married    Spouse name: Not on file   Number of children: 2   Years of education: Not on file   Highest education level: Some college, no degree  Occupational History   Not on file  Tobacco Use   Smoking status: Every Day    Types: Cigarettes   Smokeless tobacco: Not on file  Substance and Sexual Activity   Alcohol use: Yes    Comment: occasionally   Drug use: Yes    Types: Amphetamines, Heroin   Sexual activity: Not on file  Other Topics Concern   Not on file  Social History Narrative   Not on file   Social Determinants of Health   Financial Resource Strain: Not on file  Food Insecurity: Not on file  Transportation Needs: Not on file  Physical Activity: Not on file  Stress: Not on file   Social Connections: Not on file   Outpatient Medications Prior to Visit  Medication Sig   amLODipine (NORVASC) 10 MG tablet Take 1 tablet (10 mg total) by mouth daily.   lisinopril (ZESTRIL) 20 MG tablet Take 1 tablet (20 mg total) by mouth daily.   metoprolol tartrate (LOPRESSOR) 25 MG tablet Take 1 tablet (25 mg total) by mouth 2 (two) times daily.   No facility-administered medications prior to visit.   Allergies  Allergen Reactions   Penicillins Hives    bloating   Prochlorperazine Edisylate Other (See Comments)    Coming out of skin Compazine Hallucination    Immunization History  Administered Date(s) Administered   Tdap 02/05/2021    Health Maintenance  Topic Date Due   COVID-19 Vaccine (1 - 2023-24 season) Never done   INFLUENZA VACCINE  09/26/2022   DTaP/Tdap/Td (2 - Td or Tdap) 02/06/2031   Hepatitis C Screening  Completed   HIV Screening  Completed   HPV VACCINES  Aged Out    Patient Care Team: Pcp, No as PCP - General  Review of Systems Except see HPI   {Insert previous labs (optional):23779}  {See past labs  Heme  Chem  Endocrine  Serology  Results Review (optional):1}   Objective    There were no vitals taken for this visit. {Insert last BP/Wt (optional):23777}  {  See vitals history (optional):1}  Physical Exam  Depression Screen     No data to display         No results found for any visits on 09/13/22.  Assessment & Plan     *** Assessment and Plan              Encounter to establish care Welcomed to our clinic Reviewed past medical hx, social hx, family hx and surgical hx Pt advised to send all vaccination records or screening   No follow-ups on file.     [facility]

## 2022-09-13 ENCOUNTER — Ambulatory Visit: Payer: BLUE CROSS/BLUE SHIELD | Admitting: Physician Assistant

## 2022-09-13 DIAGNOSIS — F1920 Other psychoactive substance dependence, uncomplicated: Secondary | ICD-10-CM

## 2022-09-13 DIAGNOSIS — F1990 Other psychoactive substance use, unspecified, uncomplicated: Secondary | ICD-10-CM

## 2022-09-13 DIAGNOSIS — I10 Essential (primary) hypertension: Secondary | ICD-10-CM

## 2023-03-14 ENCOUNTER — Ambulatory Visit (HOSPITAL_BASED_OUTPATIENT_CLINIC_OR_DEPARTMENT_OTHER): Payer: BLUE CROSS/BLUE SHIELD | Admitting: Student
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
## 2023-04-09 ENCOUNTER — Ambulatory Visit: Payer: BLUE CROSS/BLUE SHIELD | Admitting: Family Medicine

## 2023-04-09 NOTE — Progress Notes (Deleted)
 CHIEF COMPLAINT: No chief complaint on file.  _____________________________________________________________ SUBJECTIVE  HPI  Pt is a 43 y.o. male here for evaluation of  Ongoing for *** Inciting event: Primarily located   Radiating Numbness/tingling Catching/locking *** Exacerbated by Therapies tried so far:  Works as Psychiatric nurse***   ------------------------------------------------------------------------------------------------------ Past Medical History:  Diagnosis Date   Heroin abuse (HCC)    IVDU (intravenous drug user)    Polysubstance abuse (HCC)     Past Surgical History:  Procedure Laterality Date   POSTERIOR CERVICAL FUSION/FORAMINOTOMY N/A 05/21/2021   Procedure: CERVICAL ONE-TWO POSTERIOR INSTRUMENTED FUSION;  Surgeon: Jadene Pierini, MD;  Location: [facility] OR;  Service: Neurosurgery;  Laterality: N/A;   TEE WITHOUT CARDIOVERSION N/A 05/24/2021   Procedure: TRANSESOPHAGEAL ECHOCARDIOGRAM (TEE);  Surgeon: Thomasene Ripple, DO;  Location: [facility] END[facility]COPY;  Service: Cardiovascular;  Laterality: N/A;      Outpatient Encounter Medications as of 04/09/2023  Medication Sig   amLODipine (NORVASC) 10 MG tablet Take 1 tablet (10 mg total) by mouth daily.   lisinopril (ZESTRIL) 20 MG tablet Take 1 tablet (20 mg total) by mouth daily.   metoprolol tartrate (LOPRESSOR) 25 MG tablet Take 1 tablet (25 mg total) by mouth 2 (two) times daily.   No facility-administered encounter medications on file as of 04/09/2023.    ------------------------------------------------------------------------------------------------------  _____________________________________________________________ OBJECTIVE  PHYSICAL EXAM  There were no vitals filed for this visit. There is no height or weight on file to calculate BMI.   reviewed  General: A+Ox3, no acute distress, well-nourished, appropriate affect CV: pulses 2+ regular, nondiaphoretic, no peripheral edema, cap refill <2sec Lungs: no  audible wheezing, non-labored breathing, bilateral chest rise/fall, nontachypneic Skin: warm, well-perfused, non-icteric, no susp lesions or rashes Neuro: *** Sensation intact, muscle tone wnl, no atrophy Psych: no signs of depression or anxiety MSK: ***      _____________________________________________________________ ASSESSMENT/PLAN There are no diagnoses linked to this encounter.      Electronically signed by: Burna Forts, MD 04/09/2023 7:46 AM

## 2023-04-10 ENCOUNTER — Ambulatory Visit: Payer: BLUE CROSS/BLUE SHIELD | Admitting: Family Medicine

## 2023-04-14 ENCOUNTER — Ambulatory Visit: Payer: BLUE CROSS/BLUE SHIELD | Admitting: Family Medicine

## 2023-04-14 ENCOUNTER — Ambulatory Visit: Payer: BLUE CROSS/BLUE SHIELD | Admitting: Orthopedic Surgery

## 2023-04-22 ENCOUNTER — Encounter: Payer: BLUE CROSS/BLUE SHIELD | Admitting: Family

## 2023-04-22 NOTE — Progress Notes (Signed)
   This encounter was created in error - please disregard. No show

## 2023-05-13 ENCOUNTER — Encounter: Payer: BLUE CROSS/BLUE SHIELD | Admitting: Urology

## 2023-06-18 ENCOUNTER — Telehealth

## 2023-06-24 ENCOUNTER — Encounter: Admitting: Urology

## 2023-06-26 ENCOUNTER — Ambulatory Visit: Admitting: Family Medicine

## 2023-07-03 ENCOUNTER — Ambulatory Visit: Admitting: Orthopedic Surgery

## 2023-07-14 ENCOUNTER — Encounter: Payer: Self-pay | Admitting: *Deleted

## 2023-07-14 ENCOUNTER — Ambulatory Visit: Admitting: Physician Assistant

## 2023-07-18 ENCOUNTER — Ambulatory Visit: Payer: MEDICAID | Admitting: Urology

## 2023-07-18 DIAGNOSIS — N529 Male erectile dysfunction, unspecified: Secondary | ICD-10-CM

## 2023-09-18 ENCOUNTER — Encounter: Payer: Self-pay | Admitting: Urgent Care

## 2023-09-18 ENCOUNTER — Ambulatory Visit (INDEPENDENT_AMBULATORY_CARE_PROVIDER_SITE_OTHER): Payer: MEDICAID | Admitting: Urgent Care

## 2023-09-18 VITALS — BP 121/87 | HR 94 | Temp 98.0°F | Resp 17 | Ht 72.0 in | Wt 227.2 lb

## 2023-09-18 DIAGNOSIS — S129XXD Fracture of neck, unspecified, subsequent encounter: Secondary | ICD-10-CM

## 2023-09-18 DIAGNOSIS — F1990 Other psychoactive substance use, unspecified, uncomplicated: Secondary | ICD-10-CM

## 2023-09-18 DIAGNOSIS — F1920 Other psychoactive substance dependence, uncomplicated: Secondary | ICD-10-CM

## 2023-09-18 DIAGNOSIS — D649 Anemia, unspecified: Secondary | ICD-10-CM | POA: Diagnosis not present

## 2023-09-18 DIAGNOSIS — E875 Hyperkalemia: Secondary | ICD-10-CM

## 2023-09-18 DIAGNOSIS — R945 Abnormal results of liver function studies: Secondary | ICD-10-CM | POA: Diagnosis not present

## 2023-09-18 DIAGNOSIS — L02414 Cutaneous abscess of left upper limb: Secondary | ICD-10-CM

## 2023-09-18 MED ORDER — CIPROFLOXACIN HCL 500 MG PO TABS: 500.0000 mg | ORAL_TABLET | Freq: Two times a day (BID) | ORAL | 0 refills | Status: AC

## 2023-09-18 MED ORDER — CEFTRIAXONE SODIUM 1 G IJ SOLR
1.0000 g | Freq: Once | INTRAMUSCULAR | Status: AC
  Administered 2023-09-18: 1 g via INTRAMUSCULAR

## 2023-09-18 MED ORDER — DOXYCYCLINE HYCLATE 100 MG PO TABS: 100.0000 mg | ORAL_TABLET | Freq: Two times a day (BID) | ORAL | 0 refills | Status: AC
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
## 2023-09-18 NOTE — Patient Instructions (Addendum)
 Start cipro  twice daily x 10 days Start doxycycline  twice daily x 10 days.  Please return in one week for recheck of your wound.

## 2023-09-19 ENCOUNTER — Ambulatory Visit: Payer: Self-pay | Admitting: Urgent Care

## 2023-09-19 DIAGNOSIS — F1990 Other psychoactive substance use, unspecified, uncomplicated: Secondary | ICD-10-CM

## 2023-09-19 DIAGNOSIS — R945 Abnormal results of liver function studies: Secondary | ICD-10-CM

## 2023-09-19 LAB — COMPREHENSIVE METABOLIC PANEL WITH GFR
ALT: 17 IU/L (ref 0–44)
AST: 22 IU/L (ref 0–40)
Albumin: 4.1 g/dL (ref 4.1–5.1)
Alkaline Phosphatase: 119 IU/L (ref 44–121)
BUN/Creatinine Ratio: 27 — ABNORMAL HIGH (ref 9–20)
BUN: 22 mg/dL (ref 6–24)
Bilirubin Total: 0.2 mg/dL (ref 0.0–1.2)
CO2: 21 mmol/L (ref 20–29)
Calcium: 9.4 mg/dL (ref 8.7–10.2)
Chloride: 102 mmol/L (ref 96–106)
Creatinine, Ser: 0.82 mg/dL (ref 0.76–1.27)
Globulin, Total: 3.2 g/dL (ref 1.5–4.5)
Glucose: 84 mg/dL (ref 70–99)
Potassium: 4.5 mmol/L (ref 3.5–5.2)
Sodium: 140 mmol/L (ref 134–144)
Total Protein: 7.3 g/dL (ref 6.0–8.5)
eGFR: 112 mL/min/1.73 (ref >59)

## 2023-09-19 LAB — CBC WITH DIFFERENTIAL/PLATELET
Basophils Absolute: 0 x10E3/uL (ref 0.0–0.2)
Basos: 1 %
EOS (ABSOLUTE): 0.2 x10E3/uL (ref 0.0–0.4)
Eos: 3 %
Hematocrit: 39.2 % (ref 37.5–51.0)
Hemoglobin: 12.9 g/dL — ABNORMAL LOW (ref 13.0–17.7)
Immature Grans (Abs): 0 x10E3/uL (ref 0.0–0.1)
Immature Granulocytes: 0 %
Lymphocytes Absolute: 2.9 x10E3/uL (ref 0.7–3.1)
Lymphs: 38 %
MCH: 30.4 pg (ref 26.6–33.0)
MCHC: 32.9 g/dL (ref 31.5–35.7)
MCV: 92 fL (ref 79–97)
Monocytes Absolute: 0.7 x10E3/uL (ref 0.1–0.9)
Monocytes: 9 %
Neutrophils Absolute: 3.8 x10E3/uL (ref 1.4–7.0)
Neutrophils: 49 %
Platelets: 286 x10E3/uL (ref 150–450)
RBC: 4.25 x10E6/uL (ref 4.14–5.80)
RDW: 13.4 % (ref 11.6–15.4)
WBC: 7.7 x10E3/uL (ref 3.4–10.8)

## 2023-09-19 LAB — TSH: TSH: 2.37 u[IU]/mL (ref 0.450–4.500)

## 2023-09-23 LAB — WOUND CULTURE

## 2023-09-23 LAB — HEPATITIS C ANTIBODY: Hep C Virus Ab: NONREACTIVE

## 2023-09-23 LAB — SPECIMEN STATUS REPORT

## 2023-09-25 ENCOUNTER — Ambulatory Visit: Payer: MEDICAID | Admitting: Urgent Care

## 2023-09-25 ENCOUNTER — Ambulatory Visit: Payer: MEDICAID | Admitting: Orthopedic Surgery

## 2023-09-25 LAB — CULTURE, BLOOD (SINGLE)

## 2023-10-08 ENCOUNTER — Ambulatory Visit: Payer: MEDICAID | Admitting: Urgent Care

## 2023-10-23 ENCOUNTER — Ambulatory Visit: Payer: MEDICAID | Admitting: Orthopedic Surgery

## 2023-10-24 ENCOUNTER — Ambulatory Visit: Payer: MEDICAID | Admitting: Orthopedic Surgery

## 2023-11-07 ENCOUNTER — Encounter: Payer: MEDICAID | Admitting: Urology

## 2023-11-07 ENCOUNTER — Encounter: Payer: Self-pay | Admitting: Urology

## 2023-12-29 ENCOUNTER — Encounter: Payer: Self-pay | Admitting: Radiology
